# Patient Record
Sex: Female | Born: 1975 | Race: Black or African American | Hispanic: No | Marital: Single | Smoking: Former smoker
Health system: Southern US, Community
[De-identification: ages and names within clinical notes are randomized; demographics above are authoritative.]

---

## 1997-08-02 ENCOUNTER — Ambulatory Visit (HOSPITAL_COMMUNITY): Admission: RE | Admit: 1997-08-02 | Discharge: 1997-08-02 | Payer: Self-pay | Admitting: Obstetrics & Gynecology

## 1997-09-15 ENCOUNTER — Inpatient Hospital Stay (HOSPITAL_COMMUNITY): Admission: AD | Admit: 1997-09-15 | Discharge: 1997-09-15 | Payer: Self-pay | Admitting: Obstetrics & Gynecology

## 1997-09-19 ENCOUNTER — Inpatient Hospital Stay (HOSPITAL_COMMUNITY): Admission: AD | Admit: 1997-09-19 | Discharge: 1997-09-19 | Payer: Self-pay | Admitting: Obstetrics

## 1997-09-19 ENCOUNTER — Encounter: Admission: RE | Admit: 1997-09-19 | Discharge: 1997-12-18 | Payer: Self-pay | Admitting: Obstetrics & Gynecology

## 1997-09-26 ENCOUNTER — Inpatient Hospital Stay (HOSPITAL_COMMUNITY): Admission: AD | Admit: 1997-09-26 | Discharge: 1997-09-26 | Payer: Self-pay | Admitting: Obstetrics

## 1997-09-27 ENCOUNTER — Inpatient Hospital Stay (HOSPITAL_COMMUNITY): Admission: AD | Admit: 1997-09-27 | Discharge: 1997-09-29 | Payer: Self-pay | Admitting: Obstetrics

## 1997-10-01 ENCOUNTER — Inpatient Hospital Stay (HOSPITAL_COMMUNITY): Admission: EM | Admit: 1997-10-01 | Discharge: 1997-10-01 | Payer: Self-pay | Admitting: Obstetrics & Gynecology

## 2002-01-13 ENCOUNTER — Other Ambulatory Visit: Admission: RE | Admit: 2002-01-13 | Discharge: 2002-01-13 | Payer: Self-pay | Admitting: Gynecology

## 2002-06-11 ENCOUNTER — Encounter: Payer: Self-pay | Admitting: Gynecology

## 2002-06-11 ENCOUNTER — Inpatient Hospital Stay (HOSPITAL_COMMUNITY): Admission: AD | Admit: 2002-06-11 | Discharge: 2002-06-11 | Payer: Self-pay | Admitting: Gynecology

## 2002-07-27 ENCOUNTER — Inpatient Hospital Stay (HOSPITAL_COMMUNITY): Admission: AD | Admit: 2002-07-27 | Discharge: 2002-07-29 | Payer: Self-pay | Admitting: Gynecology

## 2002-07-27 ENCOUNTER — Encounter (INDEPENDENT_AMBULATORY_CARE_PROVIDER_SITE_OTHER): Payer: Self-pay

## 2003-02-02 ENCOUNTER — Other Ambulatory Visit: Admission: RE | Admit: 2003-02-02 | Discharge: 2003-02-02 | Payer: Self-pay | Admitting: Gynecology

## 2004-06-30 ENCOUNTER — Other Ambulatory Visit: Admission: RE | Admit: 2004-06-30 | Discharge: 2004-06-30 | Payer: Self-pay | Admitting: Gynecology

## 2005-07-03 ENCOUNTER — Other Ambulatory Visit: Admission: RE | Admit: 2005-07-03 | Discharge: 2005-07-03 | Payer: Self-pay | Admitting: Gynecology

## 2006-07-19 ENCOUNTER — Other Ambulatory Visit: Admission: RE | Admit: 2006-07-19 | Discharge: 2006-07-19 | Payer: Self-pay | Admitting: Gynecology

## 2007-07-11 ENCOUNTER — Emergency Department (HOSPITAL_COMMUNITY): Admission: EM | Admit: 2007-07-11 | Discharge: 2007-07-11 | Payer: Self-pay | Admitting: Emergency Medicine

## 2007-07-22 ENCOUNTER — Other Ambulatory Visit: Admission: RE | Admit: 2007-07-22 | Discharge: 2007-07-22 | Payer: Self-pay | Admitting: Obstetrics and Gynecology

## 2008-01-18 ENCOUNTER — Other Ambulatory Visit: Admission: RE | Admit: 2008-01-18 | Discharge: 2008-01-18 | Payer: Self-pay | Admitting: Gynecology

## 2008-04-02 ENCOUNTER — Ambulatory Visit: Payer: Self-pay | Admitting: Gynecology

## 2008-07-27 ENCOUNTER — Encounter: Payer: Self-pay | Admitting: Women's Health

## 2008-07-27 ENCOUNTER — Ambulatory Visit: Payer: Self-pay | Admitting: Women's Health

## 2008-07-27 ENCOUNTER — Other Ambulatory Visit: Admission: RE | Admit: 2008-07-27 | Discharge: 2008-07-27 | Payer: Self-pay | Admitting: Gynecology

## 2008-08-24 ENCOUNTER — Ambulatory Visit: Payer: Self-pay | Admitting: Women's Health

## 2009-09-13 ENCOUNTER — Ambulatory Visit: Payer: Self-pay | Admitting: Family

## 2009-09-13 ENCOUNTER — Inpatient Hospital Stay (HOSPITAL_COMMUNITY): Admission: AD | Admit: 2009-09-13 | Discharge: 2009-09-13 | Payer: Self-pay | Admitting: Obstetrics and Gynecology

## 2009-11-07 ENCOUNTER — Observation Stay (HOSPITAL_COMMUNITY): Admission: AD | Admit: 2009-11-07 | Discharge: 2009-11-08 | Payer: Self-pay | Admitting: Obstetrics and Gynecology

## 2009-12-07 ENCOUNTER — Inpatient Hospital Stay (HOSPITAL_COMMUNITY): Admission: AD | Admit: 2009-12-07 | Discharge: 2009-12-07 | Payer: Self-pay | Admitting: Obstetrics and Gynecology

## 2009-12-19 ENCOUNTER — Ambulatory Visit (HOSPITAL_COMMUNITY): Admission: RE | Admit: 2009-12-19 | Discharge: 2009-12-19 | Payer: Self-pay | Admitting: Obstetrics and Gynecology

## 2010-01-01 ENCOUNTER — Ambulatory Visit: Payer: Self-pay | Admitting: Advanced Practice Midwife

## 2010-01-01 ENCOUNTER — Inpatient Hospital Stay (HOSPITAL_COMMUNITY): Admission: AD | Admit: 2010-01-01 | Discharge: 2010-01-01 | Payer: Self-pay | Admitting: Obstetrics and Gynecology

## 2010-01-09 ENCOUNTER — Inpatient Hospital Stay (HOSPITAL_COMMUNITY): Admission: AD | Admit: 2010-01-09 | Discharge: 2010-01-10 | Payer: Self-pay | Admitting: Obstetrics and Gynecology

## 2010-01-21 ENCOUNTER — Inpatient Hospital Stay (HOSPITAL_COMMUNITY): Admission: RE | Admit: 2010-01-21 | Discharge: 2010-01-23 | Payer: Self-pay | Admitting: Obstetrics & Gynecology

## 2010-03-19 ENCOUNTER — Other Ambulatory Visit: Admission: RE | Admit: 2010-03-19 | Discharge: 2010-03-19 | Payer: Self-pay | Admitting: Gynecology

## 2010-03-19 ENCOUNTER — Ambulatory Visit: Payer: Self-pay | Admitting: Women's Health

## 2010-03-27 ENCOUNTER — Ambulatory Visit: Payer: Self-pay | Admitting: Gynecology

## 2010-06-03 ENCOUNTER — Ambulatory Visit: Payer: Self-pay | Admitting: Gynecology

## 2010-08-29 LAB — CBC
Hemoglobin: 10.8 g/dL — ABNORMAL LOW (ref 12.0–15.0)
MCHC: 33.2 g/dL (ref 30.0–36.0)
Platelets: 222 10*3/uL (ref 150–400)
RDW: 16.1 % — ABNORMAL HIGH (ref 11.5–15.5)
RDW: 16.2 % — ABNORMAL HIGH (ref 11.5–15.5)
WBC: 12.1 10*3/uL — ABNORMAL HIGH (ref 4.0–10.5)

## 2010-08-29 LAB — RPR: RPR Ser Ql: NONREACTIVE

## 2010-08-30 LAB — URINE MICROSCOPIC-ADD ON

## 2010-08-30 LAB — URINALYSIS, ROUTINE W REFLEX MICROSCOPIC
Nitrite: NEGATIVE
Specific Gravity, Urine: <1.005 — ABNORMAL LOW (ref 1.005–1.030)
Urobilinogen, UA: 0.2 mg/dL (ref 0.0–1.0)

## 2010-08-31 LAB — URINE CULTURE: Colony Count: 50000

## 2010-08-31 LAB — WET PREP, GENITAL

## 2010-08-31 LAB — URINE MICROSCOPIC-ADD ON

## 2010-08-31 LAB — URINALYSIS, ROUTINE W REFLEX MICROSCOPIC
Glucose, UA: NEGATIVE mg/dL
Hgb urine dipstick: NEGATIVE
Ketones, ur: NEGATIVE mg/dL
Protein, ur: NEGATIVE mg/dL

## 2010-09-01 LAB — CBC
HCT: 31.4 % — ABNORMAL LOW (ref 36.0–46.0)
Hemoglobin: 10.6 g/dL — ABNORMAL LOW (ref 12.0–15.0)
MCHC: 33.7 g/dL (ref 30.0–36.0)
MCV: 91.3 fL (ref 78.0–100.0)
RDW: 14.2 % (ref 11.5–15.5)

## 2010-09-01 LAB — URINALYSIS, ROUTINE W REFLEX MICROSCOPIC
Bilirubin Urine: NEGATIVE
Nitrite: NEGATIVE
Protein, ur: NEGATIVE mg/dL
Specific Gravity, Urine: 1.025 (ref 1.005–1.030)
Urobilinogen, UA: 0.2 mg/dL (ref 0.0–1.0)

## 2010-09-01 LAB — URINE CULTURE: Colony Count: 40000

## 2010-09-01 LAB — FETAL FIBRONECTIN: Fetal Fibronectin: NEGATIVE

## 2010-09-01 LAB — URINE MICROSCOPIC-ADD ON

## 2010-10-31 NOTE — Discharge Summary (Signed)
   NAME:  [name], [name]                         ACCOUNT NO.:  [id number]   MEDICAL RECORD NO.:  [id number]                   PATIENT TYPE:  INP   LOCATION:  9105                                 FACILITY:  [facility]   PHYSICIAN:  Juan H. Lily Peer, M.D.             DATE OF BIRTH:  July 10, 1975   DATE OF ADMISSION:  07/27/2002  DATE OF DISCHARGE:  07/29/2002                                 DISCHARGE SUMMARY   DISCHARGE DIAGNOSES:  1. Intrauterine pregnancy at 38 weeks, delivered.  2. Status post spontaneous vaginal delivery.  3. History of poor obstetrical history.  4. History of intrauterine fetal demise at 40 weeks thought to be secondary     to umbilical clot.   HISTORY:  This is a 26-years-of-age female gravida 3 para 2 with an EDC of  August 10, 2002.  The prenatal course had been complicated by history of  poor OB history.  She had had an IUFD at term thought to be secondary to  umbilical clot.  She also had a second child with undetermined abnormality  syndrome somewhere in-between mental retardation and autism.  Had normal  amniocentesis in this pregnancy.   HOSPITAL COURSE:  On July 27, 2002 the patient presented at 38 weeks in  labor.  Subsequently, on July 27, 2002 the patient underwent delivery of  a female infant, Apgars 9 and 9, weight of 7 pounds 12 ounces.  There was no  episiotomy, no lacerations.  Postpartum the patient remained afebrile,  voiding, in stable condition.  She was discharged to home on July 29, 2002 and given [facility] Gynecology postpartum instructions and postpartum  booklet.   ACCESSORY CLINICAL FINDINGS/LABORATORY DATA:  The patient is A positive.  Rubella immune.  On July 28, 2002 hemoglobin was 7.5.   DISPOSITION:  1. The patient was discharged to home.  2. Given prescription for Tylox p.r.n. pain.     Susa Loffler, P.A.                    Juan H. Lily Peer, M.D.    TSG/MEDQ  D:  08/18/2002  T:  08/19/2002  Job:   [id number]

## 2010-10-31 NOTE — Consult Note (Signed)
NAME:  [name], [name]                         ACCOUNT NO.:  [id number]   MEDICAL RECORD NO.:  [id number]                   PATIENT TYPE:  MAT   LOCATION:  MATC                                 [facility]ACILITY:  [facility]   PHYSICIAN:  Juan H. Lily Peer, M.D.             DATE OF BIRTH:  1975/08/29   DATE OF CONSULTATION:  DATE OF DISCHARGE:                                   CONSULTATION   HISTORY OF PRESENT ILLNESS:  The patient is a 35 year old Gravida III, Para  II with an estimated date of confinement August 10, 2002, currently 59 and  1/[redacted] weeks gestational age. She presented to [facility] today stating  that since last night, she had felt some upper abdominal discomfort and as a  result, was thinking of her child. She denied any vaginal discharge,  dysuria, or frequency of any sort. She was placed on a monitor and a  reassuring tracing was noted. She is noted to have some mild contractions at  times and while waiting for an ultrasound, she received IV fluids of  lactated ringers fluid bolus 500 cc and followed by 150 cc per hour. A wet  prep was done which actually demonstrated bacteria and many WBC's. And  subsequently, Terbutaline 0.25 mg subcutaneous was given to arrest the mild  contraction that she was having while we were waiting for the ultrasound.  She continued to do well. Her abdominal pain improved. She had no abdominal  tenderness, rebound or guarding.   PHYSICAL EXAMINATION:  VITAL SIGNS: Blood pressure 113/68, pulse 88,  temperature 98.2.  PELVIC: Cervix revealed external os to be slightly dilated. Internal  cervical os closed and 40-50% effaced ballottable.   DIAGNOSTIC IMPRESSION:  The patient underwent an ultrasound which  demonstrated an intrauterine viable pregnancy of cephalic presentation with  a posterior placenta. No previa or abruption, grade I. The amniotic fluid  was appropriate for gestational age and based on the early ultrasound,  concurs with today's  estimated gestational age of [redacted] weeks with estimated  fetal weight of 1928 to 2070 grams. Transcervical ultrasound demonstrated  cervical length of 3.3 cm.   LABORATORY DATA:  The patient's blood work had consisted of a CBC with WBC  count of 9.1. Hemoglobin and hematocrit 11.4 and 33.9. Platelet count  270,000. UA had some mild proteinuria for IV fluids and as mentioned above,  wet prep had many WBC's and many bacteria.   HOSPITAL COURSE:  The patient will be released home. She is no longer  contracting. She was feeling fine. She will be given a note to stay at home  from work for the next 2-3 days. She has an appointment for next week in the  office. I will give her a prescription for Cleocin vaginal cream, to apply  at bedtime for five days for bacterial overgrowth, which she has. We also  discussed, at the request of records, that two  previous pregnancies were  delivered at [facility] by the [facility]. She did state that  the first pregnancy was an IUFD and they found a blood clot on the umbilical  cord. Based on these findings, we will go ahead and start her on baby  aspirin one tab each day. Also, we will await a copy of her records but  based on this, I think we are justifying that the benefit will out weight  the risk here and this was discussed with the patient. The patient is to  refrain from intercourse. Keep up with her fluids. If she complains of  abdominal discomfort again or any pressure, she will report to the office or  the emergency room prior to her scheduled appointment for next week.                                               Juan H. Lily Peer, M.D.    JHF/MEDQ  D:  06/11/2002  T:  06/11/2002  Job:  [id number]

## 2011-03-22 ENCOUNTER — Emergency Department (HOSPITAL_COMMUNITY)
Admission: EM | Admit: 2011-03-22 | Discharge: 2011-03-22 | Disposition: A | Discharge status: Home / Self Care | Payer: 59 | Attending: Emergency Medicine | Admitting: Emergency Medicine

## 2011-03-22 DIAGNOSIS — R51 Headache: Secondary | ICD-10-CM | POA: Insufficient documentation

## 2011-03-22 DIAGNOSIS — H9209 Otalgia, unspecified ear: Secondary | ICD-10-CM | POA: Insufficient documentation

## 2011-03-22 DIAGNOSIS — J36 Peritonsillar abscess: Secondary | ICD-10-CM | POA: Insufficient documentation

## 2011-04-06 NOTE — Consult Note (Signed)
[name], [name]               ACCOUNT NO.:  [id number]  MEDICAL RECORD NO.:  [id number]  LOCATION:  WLED                         FACILITY:  [facility]  PHYSICIAN:  Newman Pies, MD            DATE OF BIRTH:  07-11-1975  DATE OF CONSULTATION:  03/22/2011 DATE OF DISCHARGE:  03/22/2011                                CONSULTATION   SERVICE:  Otolaryngology and neck surgery service.  CHIEF COMPLAINT:  Right peritonsillar abscess.  HISTORY OF PRESENT ILLNESS:  The patient is a 35 year old female who presents to the [facility] emergency room today complaining of 5-day history of severe right-sided sore throat.  The patient was initially seen in the Urgent [facility].  She was given oral amoxicillin and IV steroids.  However, her right-sided sore throat significantly worsened over the last 24 hours.  The patient complains of significant odynophagia and dysphagia.  She also complains of difficulty talking. She has subjective low grade fever.  No shortness of breath.  She denies any previous history of peritonsillar abscess or tonsillitis.  PAST MEDICAL HISTORY: 1. Depression. 2. Urinary tract infection.  PAST SURGICAL HISTORY:  Dilation and curettage for termination of pregnancy  FAMILY HISTORY:  Positive for hypertension and cancer.  ALLERGIES:  No known drug allergies.  HOME MEDICATIONS:  Amoxicillin, Zoloft, Xanax, hydrocodone and acetaminophen, and elixir.  SOCIAL HISTORY:  The patient is a nonsmoker.  She denies the use of illegal drugs.  Occasional drinker.  IMMUNIZATIONS:  Up-to-date.  PHYSICAL EXAMINATIONS:  VITALS:  Temperature 99.5, blood pressure 128/84, pulse 92, respiration 16, and oxygen saturation 100% on room air. GENERAL:  The patient is a well-nourished and well-developed 35 year old female in no acute distress.  She is alert and oriented x3. HEENT:  Her pupils are equal, round, and reactive to light.  Extraocular motion is intact.  Examination of the ears  shows normal auricles, external auditory canals, and tympanic membranes bilaterally.  Nasal examination shows normal mucosa, septum, and turbinates.  Oral cavity examination shows normal lips, gums, tongue, and oral cavity mucosa. The right tonsil is severely edematous and erythematous.  The right peritonsillar area is also severely edematous, pushing the uvula to the left side. NECK:  Palpation of the neck reveals no significant lymphadenopathy or mass.  The trachea is midline.  The right side of the neck is mildly tender to touch.  PROCEDURE PERFORMED:  Incision and drainage of right peritonsillar abscess.  ANESTHESIA:  Local anesthesia with 1% lidocaine with 1:100,000 epinephrine.  DESCRIPTION:  The patient is placed upright on the hospital bed.  1% lidocaine with 1:100,000 epinephrine is infiltrated around the right peritonsillar area.  After adequate local anesthesia is achieved, an 18- gauge spinal needle is used to make multiple passes over the right superior peritonsillar area.  Approximately 4 mL of purulent fluid is aspirated.  Using the tip of the spinal needle, the right peritonsillar area is carefully incised.  The patient tolerated the procedure well.  IMPRESSION:  Right peritonsillar abscess.  RECOMMENDATIONS: 1. Incision and drainage of right peritonsillar abscess in the     emergency room. 2. Clindamycin 300 mg p.o. q.i.d. for 10  days. 3. The patient will follow up in my office in approximately 1 week if     she continues to be symptomatic.     Newman Pies, MD     ST/MEDQ  D:  03/22/2011  T:  03/22/2011  Job:  [id number]  Electronically Signed by Newman Pies MD on 04/06/2011 05:44:38 PM

## 2012-04-28 ENCOUNTER — Emergency Department (HOSPITAL_COMMUNITY): Payer: Self-pay

## 2012-04-28 ENCOUNTER — Emergency Department (HOSPITAL_COMMUNITY)
Admission: EM | Admit: 2012-04-28 | Discharge: 2012-04-28 | Disposition: A | Discharge status: Home / Self Care | Payer: Self-pay | Attending: Emergency Medicine | Admitting: Emergency Medicine

## 2012-04-28 ENCOUNTER — Encounter (HOSPITAL_COMMUNITY): Payer: Self-pay | Admitting: *Deleted

## 2012-04-28 DIAGNOSIS — M5410 Radiculopathy, site unspecified: Secondary | ICD-10-CM

## 2012-04-28 DIAGNOSIS — E119 Type 2 diabetes mellitus without complications: Secondary | ICD-10-CM | POA: Insufficient documentation

## 2012-04-28 DIAGNOSIS — J069 Acute upper respiratory infection, unspecified: Secondary | ICD-10-CM | POA: Insufficient documentation

## 2012-04-28 DIAGNOSIS — M5412 Radiculopathy, cervical region: Secondary | ICD-10-CM | POA: Insufficient documentation

## 2012-04-28 DIAGNOSIS — R05 Cough: Secondary | ICD-10-CM | POA: Insufficient documentation

## 2012-04-28 DIAGNOSIS — R059 Cough, unspecified: Secondary | ICD-10-CM | POA: Insufficient documentation

## 2012-04-28 DIAGNOSIS — J45901 Unspecified asthma with (acute) exacerbation: Secondary | ICD-10-CM | POA: Insufficient documentation

## 2012-04-28 DIAGNOSIS — I10 Essential (primary) hypertension: Secondary | ICD-10-CM | POA: Insufficient documentation

## 2012-04-28 MED ORDER — HYDROCODONE-HOMATROPINE 5-1.5 MG/5ML PO SYRP: 5.0000 mL | ORAL_SOLUTION | Freq: Four times a day (QID) | ORAL | Status: DC | PRN

## 2012-04-28 MED ORDER — CYCLOBENZAPRINE HCL 10 MG PO TABS: 10.0000 mg | ORAL_TABLET | Freq: Three times a day (TID) | ORAL | Status: DC | PRN

## 2012-04-28 MED ORDER — ALBUTEROL SULFATE (5 MG/ML) 0.5% IN NEBU
5.0000 mg | INHALATION_SOLUTION | Freq: Once | RESPIRATORY_TRACT | Status: AC
  Administered 2012-04-28: 5 mg via RESPIRATORY_TRACT
  Filled 2012-04-28: qty 1

## 2012-04-28 NOTE — ED Notes (Signed)
Ambulated with pulse ox Pt sats at 100%.  PA Paz aware.

## 2012-04-28 NOTE — ED Notes (Signed)
Pt states that she has not been able to lay down and sleep, she has had to sit up due to shortness of breath

## 2012-04-28 NOTE — ED Provider Notes (Signed)
History     CSN: [id number]  Arrival date & time 04/28/12  1720   First MD Initiated Contact with Patient 04/28/12 1737      Chief Complaint  Patient presents with  . Shortness of Breath    (Consider location/radiation/quality/duration/timing/severity/associated sxs/prior treatment) HPI Comments: 36 year old with a history of asthma, diabetes and hypertension single presents emergency department with multiple complaints.  Primary complaint is shortness of breath onset 3 days ago.  Symptoms associated with cough that started 2 days ago.  Cough is dry and nonproductive.  Patient reports that it is difficult lying flat and ureter breathing when sitting up.  Patient denies any chest pain, pleurisy, leg swelling, calf pain, recent travel, estrogen use, smoking history, palpitations or lightheadedness.  In addition patient states that she thinks she slept wrong in her left shoulder is hurting.  Movements with neck and arm causes radicular type pain that shoots down to her elbow.  Patient denies any numbness weakness or tingling of extremity.  There's been no recent trauma.  The history is provided by the patient.    History reviewed. No pertinent past medical history.  History reviewed. No pertinent past surgical history.  No family history on file.  History  Substance Use Topics  . Smoking status: Never Smoker   . Smokeless tobacco: Not on file  . Alcohol Use: Yes    OB History    Grav Para Term Preterm Abortions TAB SAB Ect Mult Living                  Review of Systems  Constitutional: Negative for fever, chills and appetite change.  HENT: Negative for congestion.   Eyes: Negative for visual disturbance.  Respiratory: Positive for cough and shortness of breath. Negative for chest tightness, wheezing and stridor.   Cardiovascular: Negative for chest pain, palpitations and leg swelling.  Gastrointestinal: Negative for abdominal pain.  Genitourinary: Negative for dysuria,  urgency and frequency.  Musculoskeletal:       Left shoulder/neck pain   Neurological: Negative for dizziness, syncope, weakness, light-headedness, numbness and headaches.  Psychiatric/Behavioral: Negative for confusion.  All other systems reviewed and are negative.    Allergies  Review of patient's allergies indicates no known allergies.  Home Medications   Current Outpatient Rx  Name  Route  Sig  Dispense  Refill  . ACETAMINOPHEN 500 MG PO TABS   Oral   Take 500 mg by mouth every 6 (six) hours as needed. For pain         . ALPRAZOLAM 0.5 MG PO TABS   Oral   Take 0.5 mg by mouth daily as needed. For anxiety         . ASPIRIN 325 MG PO TABS   Oral   Take 325 mg by mouth daily as needed. For pain           BP 143/82  Pulse 90  Temp 98.3 F (36.8 C) (Oral)  Resp 16  SpO2 100%  Physical Exam  Nursing note and vitals reviewed. Constitutional: She is oriented to person, place, and time. She appears well-developed and well-nourished. No distress.  HENT:  Head: Normocephalic and atraumatic.       Nasal congestion & rhinorrhea. Normal L & R external ear canals and TMs. No tragal or mastoid tenderness. Oropharynx moist, without tonsillar exudate.   Eyes:       No pain w EOM. Conjunctiva normal   Neck: Normal range of motion.  Soft, no nuchal rigidity. No lymphadenopathy. Muscular soreness over left trapezius   Cardiovascular:       RRR, no aberrancy on auscultation  Pulmonary/Chest: Effort normal.       LCAB, no respiratory distress  Musculoskeletal: Normal range of motion.       Mild radicular type pain felt w shoulder ROM, no crepitus or bony ttp  Neurological: She is alert and oriented to person, place, and time.  Skin: Skin is warm and dry. She is not diaphoretic.       No rash  Psychiatric: She has a normal mood and affect. Her behavior is normal.    ED Course  Procedures (including critical care time)  Labs Reviewed - No data to display Dg Chest  2 View  04/28/2012  *RADIOLOGY REPORT*  Clinical Data: Left chest/shoulder pain, shortness of breath  CHEST - 2 VIEW  Comparison: 07/11/2007  Findings: Lungs are clear. No pleural effusion or pneumothorax.  Cardiomediastinal silhouette is within normal limits.  Visualized osseous structures are within normal limits.  IMPRESSION: Normal chest radiographs.   Original Report Authenticated By: Charline Bills, M.D.      Date: 04/28/2012  Rate: 91  Rhythm: normal sinus rhythm  QRS Axis: normal  Intervals: normal  ST/T Wave abnormalities: normal  Conduction Disutrbances: none  Narrative Interpretation:   Old EKG Reviewed: No old EKG    No diagnosis found. Normal pulse ox while ambulating   MDM  Radicular shoulder pain, URI  Pt CXR negative for acute infiltrate. Patients symptoms are consistent with URI, likely viral etiology. Discussed that antibiotics are not indicated for viral infections. Pt will be discharged with symptomatic treatment.  Verbalizes understanding and is agreeable with plan. Pt is hemodynamically stable & in NAD prior to dc. In addition she c/o left shoulder/neck muscular soreness associated with radicular type pain that shoots down to her elbow w certain movements. Onset occurred after deep sleep in "awkward" position. Muscle relaxers and conservative home therapies discussed.     Radiculopathy   Pt presents to the ER with neck pain that is radiating down arm. There has been no recent trauma and imaging is not indicated at this time. No evidence of cervical nerve root compression on physical exam. DC w conservative home therapies (ie heat).        Jaci Carrel, PA-C 04/28/12 1850  Jaci Carrel, [place] 04/28/12 1858

## 2012-04-28 NOTE — ED Notes (Signed)
Patient with shortness of breath and left arm pain for about three days.  States that it is worse when she lays down, seems to not getting enough air.  Patient also has been coughing for about two days.

## 2012-04-30 NOTE — ED Provider Notes (Signed)
Medical screening examination/treatment/procedure(s) were performed by non-physician practitioner and as supervising physician I was immediately available for consultation/collaboration.  John-Adam Seaver Machia, M.D.     John-Adam Cornelious Diven, MD 04/30/12 1356 

## 2012-07-30 ENCOUNTER — Other Ambulatory Visit: Payer: Self-pay

## 2012-09-20 ENCOUNTER — Other Ambulatory Visit: Payer: Self-pay | Admitting: Family Medicine

## 2012-09-20 ENCOUNTER — Other Ambulatory Visit (HOSPITAL_COMMUNITY)
Admission: RE | Admit: 2012-09-20 | Discharge: 2012-09-20 | Disposition: A | Discharge status: Home / Self Care | Payer: 59 | Source: Ambulatory Visit | Attending: Family Medicine | Admitting: Family Medicine

## 2012-09-20 DIAGNOSIS — Z01419 Encounter for gynecological examination (general) (routine) without abnormal findings: Secondary | ICD-10-CM | POA: Insufficient documentation

## 2013-04-20 ENCOUNTER — Other Ambulatory Visit: Payer: Self-pay

## 2013-07-13 ENCOUNTER — Emergency Department (HOSPITAL_COMMUNITY): Payer: BC Managed Care – PPO

## 2013-07-13 ENCOUNTER — Emergency Department (HOSPITAL_COMMUNITY)
Admission: EM | Admit: 2013-07-13 | Discharge: 2013-07-13 | Disposition: A | Discharge status: Home / Self Care | Payer: BC Managed Care – PPO | Attending: Emergency Medicine | Admitting: Emergency Medicine

## 2013-07-13 ENCOUNTER — Encounter (HOSPITAL_COMMUNITY): Payer: Self-pay | Admitting: Emergency Medicine

## 2013-07-13 DIAGNOSIS — R12 Heartburn: Secondary | ICD-10-CM | POA: Insufficient documentation

## 2013-07-13 DIAGNOSIS — Z7982 Long term (current) use of aspirin: Secondary | ICD-10-CM | POA: Insufficient documentation

## 2013-07-13 DIAGNOSIS — K219 Gastro-esophageal reflux disease without esophagitis: Secondary | ICD-10-CM

## 2013-07-13 DIAGNOSIS — F411 Generalized anxiety disorder: Secondary | ICD-10-CM | POA: Insufficient documentation

## 2013-07-13 DIAGNOSIS — R0789 Other chest pain: Secondary | ICD-10-CM | POA: Insufficient documentation

## 2013-07-13 DIAGNOSIS — Z79899 Other long term (current) drug therapy: Secondary | ICD-10-CM | POA: Insufficient documentation

## 2013-07-13 LAB — COMPREHENSIVE METABOLIC PANEL
ALK PHOS: 47 U/L (ref 39–117)
ALT: 17 U/L (ref 0–35)
AST: 46 U/L — ABNORMAL HIGH (ref 0–37)
Albumin: 4.1 g/dL (ref 3.5–5.2)
BUN: 9 mg/dL (ref 6–23)
CALCIUM: 9.3 mg/dL (ref 8.4–10.5)
CO2: 25 meq/L (ref 19–32)
Chloride: 103 mEq/L (ref 96–112)
Creatinine, Ser: 0.97 mg/dL (ref 0.50–1.10)
GFR, EST AFRICAN AMERICAN: 87 mL/min — AB (ref >90)
GFR, EST NON AFRICAN AMERICAN: 75 mL/min — AB (ref >90)
GLUCOSE: 83 mg/dL (ref 70–99)
POTASSIUM: 4.4 meq/L (ref 3.7–5.3)
SODIUM: 141 meq/L (ref 137–147)
TOTAL PROTEIN: 8 g/dL (ref 6.0–8.3)
Total Bilirubin: 0.5 mg/dL (ref 0.3–1.2)

## 2013-07-13 LAB — CBC
HEMATOCRIT: 39.3 % (ref 36.0–46.0)
HEMOGLOBIN: 13.2 g/dL (ref 12.0–15.0)
MCH: 30.7 pg (ref 26.0–34.0)
MCHC: 33.6 g/dL (ref 30.0–36.0)
MCV: 91.4 fL (ref 78.0–100.0)
Platelets: 306 10*3/uL (ref 150–400)
RBC: 4.3 MIL/uL (ref 3.87–5.11)
RDW: 12.7 % (ref 11.5–15.5)
WBC: 7.6 10*3/uL (ref 4.0–10.5)

## 2013-07-13 LAB — POCT I-STAT TROPONIN I: TROPONIN I, POC: 0 ng/mL (ref 0.00–0.08)

## 2013-07-13 MED ORDER — RANITIDINE HCL 150 MG PO CAPS: 150.0000 mg | ORAL_CAPSULE | Freq: Every day | ORAL | Status: DC

## 2013-07-13 MED ORDER — OMEPRAZOLE 40 MG PO CPDR: 40.0000 mg | DELAYED_RELEASE_CAPSULE | Freq: Every day | ORAL | Status: DC

## 2013-07-13 MED ORDER — GI COCKTAIL ~~LOC~~
30.0000 mL | Freq: Once | ORAL | Status: AC
  Administered 2013-07-13: 30 mL via ORAL
  Filled 2013-07-13: qty 30

## 2013-07-13 NOTE — ED Notes (Signed)
Pt reports left sided chest pain since yesterday. Reports sharp in nature, pain returned today. Denies associated symptoms.

## 2013-07-13 NOTE — Discharge Instructions (Signed)
Follow up with your primary care doctor in 2 days. May need additional evaluation if symptoms persist. Return to ED should you develop worsening chest pain or shortness of breath.

## 2013-07-13 NOTE — ED Provider Notes (Signed)
CSN: [id number]     Arrival date & time 07/13/13  1317 History   First MD Initiated Contact with Patient 07/13/13 1554     Chief Complaint  Patient presents with  . Chest Pain   (Consider location/radiation/quality/duration/timing/severity/associated sxs/prior Treatment) Patient is a 38 y.o. female presenting with chest pain.  Chest Pain  38 yo female presents with c/o intermittent sharp left sided chest pain underneath her left breast. Pain rated currently at 3/10 without radiation. Patient states pain started yesterday around 1 pm. Pain does not seem to be associated with activity. Patient denies fever/chills, SOB, DOE, N/V, and palpitations. Patient does admit to recent indigestion with reflux and heartburn. Patient reports hx of heart burn and reflux that she was seen in ED for a while back, was started on OTC medication for sxs that helped. d Patient no longer taking any medications for her reflux sxs. Denies any prior medical hx. Takes Xanax prn for anxiety.   Advanced age > 42 yo: No HTN: No Hyperlipidemia: No Cigarette smoking: No Diabetes Mellitus: No Family hx of CAD or MI < 54 yo : No Female or Post menopausal: No Cocaine use: No Prior MI: No CABG: No Stress test: Yes (when she was pregnant but cant remember what it showed, though she states she does not recall being informed of abnormal results)    History reviewed. No pertinent past medical history. History reviewed. No pertinent past surgical history. No family history on file. History  Substance Use Topics  . Smoking status: Never Smoker   . Smokeless tobacco: Not on file  . Alcohol Use: Yes   OB History   Grav Para Term Preterm Abortions TAB SAB Ect Mult Living                 Review of Systems  Cardiovascular: Positive for chest pain.  All other systems reviewed and are negative.    Allergies  Review of patient's allergies indicates no known allergies.  Home Medications   Current Outpatient Rx  Name   Route  Sig  Dispense  Refill  . ALPRAZolam (XANAX) 0.5 MG tablet   Oral   Take 0.5 mg by mouth daily as needed. For anxiety         . aspirin 325 MG tablet   Oral   Take 325 mg by mouth daily as needed. For pain         . omeprazole (PRILOSEC) 40 MG capsule   Oral   Take 1 capsule (40 mg total) by mouth daily.   30 capsule   0   . ranitidine (ZANTAC) 150 MG capsule   Oral   Take 1 capsule (150 mg total) by mouth daily.   30 capsule   0    BP 119/72  Pulse 64  Temp(Src) 97.9 F (36.6 C) (Oral)  Resp 17  Ht 5\' 5"  (1.651 m)  Wt 212 lb (96.163 kg)  BMI 35.28 kg/m2  SpO2 100% Physical Exam  Nursing note and vitals reviewed. Constitutional: She is oriented to person, place, and time. She appears well-developed and well-nourished. No distress.  HENT:  Head: Normocephalic and atraumatic.  Eyes: Conjunctivae and EOM are normal. Pupils are equal, round, and reactive to light.  Neck: Normal range of motion. Neck supple. No JVD present.  Cardiovascular: Normal rate and regular rhythm.  Exam reveals no gallop and no friction rub.   No murmur heard. Pulmonary/Chest: Effort normal and breath sounds normal. No respiratory distress. She has no  wheezes. She has no rales.  Musculoskeletal: Normal range of motion. She exhibits no edema.  Neurological: She is alert and oriented to person, place, and time.  Skin: Skin is warm and dry. She is not diaphoretic.  Psychiatric: She has a normal mood and affect. Her behavior is normal.    ED Course  Procedures (including critical care time) Labs Review Labs Reviewed  COMPREHENSIVE METABOLIC PANEL - Abnormal; Notable for the following:    AST 46 (*)    GFR calc non Af Amer 75 (*)    GFR calc Af Amer 87 (*)    All other components within normal limits  CBC  POCT I-STAT TROPONIN I   Imaging Review Dg Chest 2 View  07/13/2013   CLINICAL DATA:  Left chest pain.  EXAM: CHEST  2 VIEW  COMPARISON:  DG CHEST 2 VIEW dated 04/28/2012   FINDINGS: The lungs are well-expanded. There is no focal infiltrate or pleural effusion nor pneumothorax. The cardiac silhouette is normal in size. The pulmonary vascularity is not engorged. There is no pleural effusion. The mediastinum is normal in width.  IMPRESSION: No active cardiopulmonary disease.   Electronically Signed   By: David  [name]   On: 07/13/2013 14:24    EKG Interpretation    Date/Time:  Thursday July 13 2013 13:20:50 EST Ventricular Rate:  79 PR Interval:  182 QRS Duration: 82 QT Interval:  422 QTC Calculation: 483 R Axis:   58 Text Interpretation:  Normal sinus rhythm Prolonged QT Abnormal ECG No significant change since last tracing Confirmed by SHELDON  MD, CHARLES ([id number]) on 07/13/2013 1:59:00 PM            MDM   1. Chest pain, atypical   2. Esophageal reflux   3. Heart burn    Patient afebrile with normal VS.  EKG shows no significant change from prior.  CXR normal  Troponin negative Mildly elevated AST All additional labs WNL.   Doubt cardiac etiology given patient's low risk and negative workup. Patient CP improved with GI cocktail. Suspect sxs related to GERD, given patient hx of GERD sxs.   Plan to have patient follow up with PCP in 2 days for reevaluation for sx improvement. Discussed results with patient. Patient given return precautions. Patient agrees with plan. Discharged in good condition.   Meds given in ED:  Medications  gi cocktail (Maalox,Lidocaine,Donnatal) (30 mLs Oral Given 07/13/13 1723)    Discharge Medication List as of 07/13/2013  6:31 PM    START taking these medications   Details  omeprazole (PRILOSEC) 40 MG capsule Take 1 capsule (40 mg total) by mouth daily., Starting 07/13/2013, Until Discontinued, Print    ranitidine (ZANTAC) 150 MG capsule Take 1 capsule (150 mg total) by mouth daily., Starting 07/13/2013, Until Discontinued, Print             Rudene AndaJacob Gray Jahna Liebert, PA-C 07/14/13 2019

## 2013-07-14 NOTE — ED Provider Notes (Signed)
Medical screening examination/treatment/procedure(s) were performed by non-physician practitioner and as supervising physician I was immediately available for consultation/collaboration.  EKG Interpretation    Date/Time:  Thursday July 13 2013 13:20:50 EST Ventricular Rate:  79 PR Interval:  182 QRS Duration: 82 QT Interval:  422 QTC Calculation: 483 R Axis:   58 Text Interpretation:  Normal sinus rhythm Prolonged QT Abnormal ECG No significant change since last tracing Confirmed by Bernette MayersSHELDON  MD, CHARLES ([id number]) on 07/13/2013 1:59:00 PM              Alyssa LennertJoseph L Shanley Furlough, MD 07/14/13 2251

## 2013-10-12 ENCOUNTER — Encounter: Payer: Self-pay | Admitting: Women's Health

## 2013-10-12 ENCOUNTER — Ambulatory Visit (INDEPENDENT_AMBULATORY_CARE_PROVIDER_SITE_OTHER): Payer: BC Managed Care – PPO | Admitting: Women's Health

## 2013-10-12 VITALS — BP 116/76 | Ht 65.0 in | Wt 210.0 lb

## 2013-10-12 DIAGNOSIS — B373 Candidiasis of vulva and vagina: Secondary | ICD-10-CM

## 2013-10-12 DIAGNOSIS — Z13220 Encounter for screening for lipoid disorders: Secondary | ICD-10-CM

## 2013-10-12 DIAGNOSIS — B3731 Acute candidiasis of vulva and vagina: Secondary | ICD-10-CM

## 2013-10-12 DIAGNOSIS — Z01419 Encounter for gynecological examination (general) (routine) without abnormal findings: Secondary | ICD-10-CM

## 2013-10-12 DIAGNOSIS — N898 Other specified noninflammatory disorders of vagina: Secondary | ICD-10-CM

## 2013-10-12 LAB — CBC WITH DIFFERENTIAL/PLATELET
Basophils Absolute: 0 10*3/uL (ref 0.0–0.1)
Basophils Relative: 0 % (ref 0–1)
EOS ABS: 0.3 10*3/uL (ref 0.0–0.7)
EOS PCT: 4 % (ref 0–5)
HEMATOCRIT: 35.1 % — AB (ref 36.0–46.0)
Hemoglobin: 11.6 g/dL — ABNORMAL LOW (ref 12.0–15.0)
LYMPHS ABS: 2.4 10*3/uL (ref 0.7–4.0)
LYMPHS PCT: 32 % (ref 12–46)
MCH: 29.3 pg (ref 26.0–34.0)
MCHC: 33 g/dL (ref 30.0–36.0)
MCV: 88.6 fL (ref 78.0–100.0)
MONO ABS: 0.4 10*3/uL (ref 0.1–1.0)
MONOS PCT: 6 % (ref 3–12)
Neutro Abs: 4.3 10*3/uL (ref 1.7–7.7)
Neutrophils Relative %: 58 % (ref 43–77)
Platelets: 320 10*3/uL (ref 150–400)
RBC: 3.96 MIL/uL (ref 3.87–5.11)
RDW: 13.2 % (ref 11.5–15.5)
WBC: 7.4 10*3/uL (ref 4.0–10.5)

## 2013-10-12 LAB — WET PREP FOR TRICH, YEAST, CLUE
Clue Cells Wet Prep HPF POC: NONE SEEN
Trich, Wet Prep: NONE SEEN

## 2013-10-12 LAB — LIPID PANEL
CHOL/HDL RATIO: 3.5 ratio
Cholesterol: 157 mg/dL (ref 0–200)
HDL: 45 mg/dL (ref >39)
LDL Cholesterol: 100 mg/dL — ABNORMAL HIGH (ref 0–99)
Triglycerides: 62 mg/dL (ref <150)
VLDL: 12 mg/dL (ref 0–40)

## 2013-10-12 MED ORDER — FLUCONAZOLE 150 MG PO TABS: 150.0000 mg | ORAL_TABLET | Freq: Once | ORAL | Status: DC

## 2013-10-12 NOTE — Addendum Note (Signed)
Addended by: Richardson ChiquitoWILKINSON, Pollyanna Levay S on: 10/12/2013 11:36 AM   Modules accepted: Orders

## 2013-10-12 NOTE — Progress Notes (Addendum)
[name] 03/09/1976 [id number]    History:    Presents for annual exam. Rare bleeding with Mirena IUD placed 03/2010 per Dr. Audie BoxFontaine. Same partner. Normal Pap history. Normal Pap history. HSV 1 history. Has had problems with anxiety/depression in the past on no medication.  Past medical history, past surgical history, family history and social history were all reviewed and documented in the EPIC chart. 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  ROS:  A  12 POINT ROS was performed and pertinent positives and negatives are included.  Exam:  Filed Vitals:   10/12/13 1019  BP: 116/76    General appearance:  Normal Thyroid:  Symmetrical, normal in size, without palpable masses or nodularity. Respiratory  Auscultation:  Clear without wheezing or rhonchi Cardiovascular  Auscultation:  Regular rate, without rubs, murmurs or gallops  Edema/varicosities:  Not grossly evident Abdominal  Soft,nontender, without masses, guarding or rebound.  Liver/spleen:  No organomegaly noted  Hernia:  None appreciated  Skin  Inspection:  Grossly normal   Breasts: Examined lying and sitting.     Right: Without masses, retractions, discharge or axillary adenopathy.     Left: Without masses, retractions, discharge or axillary adenopathy. Gentitourinary   Inguinal/mons:  Normal without inguinal adenopathy  External genitalia:  Normal  BUS/Urethra/Skene's glands:  Normal  Vagina:  Wet prep positive for yeast  Cervix:  Normal/IUD string in os  Uterus:   normal in size, shape and contour.  Midline and mobile  Adnexa/parametria:     Rt: Without masses or tenderness.   Lt: Without masses or tenderness.  Anus and perineum: Normal  Digital rectal exam: Normal sphincter tone without palpated masses or tenderness  Assessment/Plan:  38 y.o. SBF G4P3 for annual exam with  complaint half vaginal discharge with occasional itching.     Mirena IUD placed 03/2010 with rare bleeding Obesity Yeast  Plan: SBE's, continue regular exercise, calcium rich diet, cut calories for weight loss, vitamin D 1000 daily encouraged. Diflucan 150 by mouth x1 dose prescription, proper use given and reviewed yeast prevention. CBC, lipid panel, UA, Pap normal 2014, new screening guidelines reviewed.    [name] [facility]NP, 10:50 AM 10/12/2013

## 2013-10-12 NOTE — Patient Instructions (Signed)

## 2013-10-13 LAB — URINALYSIS W MICROSCOPIC + REFLEX CULTURE
BILIRUBIN URINE: NEGATIVE
CASTS: NONE SEEN
CRYSTALS: NONE SEEN
GLUCOSE, UA: NEGATIVE mg/dL
Hgb urine dipstick: NEGATIVE
KETONES UR: NEGATIVE mg/dL
NITRITE: NEGATIVE
PH: 6 (ref 5.0–8.0)
Protein, ur: NEGATIVE mg/dL
SPECIFIC GRAVITY, URINE: 1.025 (ref 1.005–1.030)
Urobilinogen, UA: 0.2 mg/dL (ref 0.0–1.0)

## 2013-10-13 NOTE — Addendum Note (Signed)
Addended by: Richardson ChiquitoWILKINSON, Hasten Sweitzer S on: 10/13/2013 09:53 AM   Modules accepted: Orders

## 2013-10-14 LAB — URINE CULTURE
COLONY COUNT: NO GROWTH
ORGANISM ID, BACTERIA: NO GROWTH

## 2013-10-15 ENCOUNTER — Encounter: Payer: Self-pay | Admitting: Women's Health

## 2014-01-30 ENCOUNTER — Ambulatory Visit (INDEPENDENT_AMBULATORY_CARE_PROVIDER_SITE_OTHER): Payer: BC Managed Care – PPO | Admitting: Women's Health

## 2014-01-30 ENCOUNTER — Encounter: Payer: Self-pay | Admitting: Women's Health

## 2014-01-30 DIAGNOSIS — N898 Other specified noninflammatory disorders of vagina: Secondary | ICD-10-CM

## 2014-01-30 DIAGNOSIS — B3731 Acute candidiasis of vulva and vagina: Secondary | ICD-10-CM

## 2014-01-30 DIAGNOSIS — B373 Candidiasis of vulva and vagina: Secondary | ICD-10-CM

## 2014-01-30 LAB — WET PREP FOR TRICH, YEAST, CLUE
Clue Cells Wet Prep HPF POC: NONE SEEN
Trich, Wet Prep: NONE SEEN
YEAST WET PREP: NONE SEEN

## 2014-01-30 MED ORDER — FLUCONAZOLE 150 MG PO TABS: 150.0000 mg | ORAL_TABLET | Freq: Once | ORAL | Status: DC

## 2014-01-30 NOTE — Addendum Note (Signed)
Addended by: Harrington ChallengerYOUNG, Cendy Oconnor J on: 01/30/2014 02:19 PM   Modules accepted: Orders

## 2014-01-30 NOTE — Progress Notes (Signed)
Patient ID: [name], female   DOB: 01/19/1976, 38 y.o.   MRN: [id number] Presents with complaint of white discharge with itching for 2 days. Denies abdominal pain, urinary symptoms or fever. Questions partners fidelity. Mirena IUD 03/2010 with rare spotting.  Exam: Appears well. External genitalia within normal limits, no erythema, speculum exam moderate amount of white discharge without odor, wet prep negative, GC/Chlamydia culture taken, IUD strings visible at os. Bimanual no CMT or adnexal fullness or tenderness.  Clinical yeast  Plan: Diflucan 150 mg times one dose with refill. Instructed to call if no relief of symptoms. Denies need to check HIV, hepatitis or RPR.

## 2014-01-31 LAB — GC/CHLAMYDIA PROBE AMP
CT PROBE, AMP APTIMA: NEGATIVE
GC Probe RNA: NEGATIVE

## 2014-04-16 ENCOUNTER — Encounter: Payer: Self-pay | Admitting: Women's Health

## 2014-04-18 ENCOUNTER — Encounter (HOSPITAL_COMMUNITY): Payer: Self-pay | Admitting: Emergency Medicine

## 2014-04-18 ENCOUNTER — Emergency Department (HOSPITAL_COMMUNITY)
Admission: EM | Admit: 2014-04-18 | Discharge: 2014-04-19 | Disposition: A | Discharge status: Home / Self Care | Payer: BC Managed Care – PPO | Attending: Emergency Medicine | Admitting: Emergency Medicine

## 2014-04-18 DIAGNOSIS — Z79899 Other long term (current) drug therapy: Secondary | ICD-10-CM | POA: Insufficient documentation

## 2014-04-18 DIAGNOSIS — K5900 Constipation, unspecified: Secondary | ICD-10-CM

## 2014-04-18 DIAGNOSIS — R1901 Right upper quadrant abdominal swelling, mass and lump: Secondary | ICD-10-CM | POA: Insufficient documentation

## 2014-04-18 DIAGNOSIS — R1900 Intra-abdominal and pelvic swelling, mass and lump, unspecified site: Secondary | ICD-10-CM

## 2014-04-18 DIAGNOSIS — R1011 Right upper quadrant pain: Secondary | ICD-10-CM | POA: Diagnosis present

## 2014-04-18 NOTE — ED Notes (Signed)
Pt presents with palpable mass to R side of abdomen, pt states she noticed it tonight, only painful on palpation.

## 2014-04-19 ENCOUNTER — Emergency Department (HOSPITAL_COMMUNITY): Payer: BC Managed Care – PPO

## 2014-04-19 MED ORDER — POLYETHYLENE GLYCOL 3350 17 G PO PACK: 17.0000 g | PACK | Freq: Every day | ORAL | Status: DC

## 2014-04-19 NOTE — Discharge Instructions (Signed)

## 2014-04-19 NOTE — ED Notes (Signed)
Pt ambulatory upon DC. She reports she is leaving with ALL belongings she arrived with.

## 2014-04-19 NOTE — ED Notes (Signed)
PA at bedside.

## 2014-04-19 NOTE — ED Provider Notes (Signed)
CSN: [id number]     Arrival date & time 04/18/14  2254 History   First MD Initiated Contact with Patient 04/19/14 0324     Chief Complaint  Patient presents with  . Abdominal Pain     (Consider location/radiation/quality/duration/timing/severity/associated sxs/prior Treatment) Patient is a 38 y.o. female presenting with abdominal pain. The history is provided by the patient. No language interpreter was used.  Abdominal Pain Pain location:  RUQ Associated symptoms: constipation   Associated symptoms: no chills and no fever   Associated symptoms comment:  She presents for evaluation of a palpable mass to the RUQ abdomen she noticed tonight while getting in the shower. No pain, N, V, Cough, SOB or similar symptoms previously. She reports she has been constipated recently. No abdominal surgeries in the past.    History reviewed. No pertinent past medical history. Past Surgical History  Procedure Laterality Date  . Mirena insertion  10/11   No family history on file. History  Substance Use Topics  . Smoking status: Never Smoker   . Smokeless tobacco: Never Used  . Alcohol Use: Yes     Comment: occassionally   OB History    Gravida Para Term Preterm AB TAB SAB Ectopic Multiple Living   5 3   2  2   3      Review of Systems  Constitutional: Negative for fever and chills.  HENT: Negative.   Respiratory: Negative.   Cardiovascular: Negative.   Gastrointestinal: Positive for constipation.       See HPI.  Genitourinary: Negative.  Negative for flank pain and difficulty urinating.  Musculoskeletal: Negative.   Skin: Negative.   Neurological: Negative.       Allergies  Review of patient's allergies indicates no known allergies.  Home Medications   Prior to Admission medications   Medication Sig Start Date End Date Taking? Authorizing Provider  ALPRAZolam Prudy Feeler(XANAX) 0.5 MG tablet Take 0.5 mg by mouth daily as needed for anxiety.    Yes Historical Provider, MD  ibuprofen  (ADVIL,MOTRIN) 200 MG tablet Take 800 mg by mouth every 6 (six) hours as needed for moderate pain.   Yes Historical Provider, MD  levonorgestrel (MIRENA) 20 MCG/24HR IUD 1 each by Intrauterine route once.   Yes Historical Provider, MD  fluconazole (DIFLUCAN) 150 MG tablet Take 1 tablet (150 mg total) by mouth once. 01/30/14   Harrington ChallengerNancy J Young, NP  omeprazole (PRILOSEC) 40 MG capsule Take 1 capsule (40 mg total) by mouth daily. 07/13/13   Rudene AndaJacob Gray Lackey, PA-C  ranitidine (ZANTAC) 150 MG capsule Take 1 capsule (150 mg total) by mouth daily. 07/13/13   Allen NorrisJacob Gray Lackey, PA-C   BP 157/94 mmHg  Pulse 79  Temp(Src) 98 F (36.7 C) (Oral)  Resp 22  Ht 5\' 5"  (1.651 m)  Wt 222 lb (100.699 kg)  BMI 36.94 kg/m2  SpO2 100% Physical Exam  Constitutional: She is oriented to person, place, and time. She appears well-developed and well-nourished.  Neck: Normal range of motion.  Pulmonary/Chest: Effort normal.  Abdominal: Soft. She exhibits mass. She exhibits no distension.  Palpable mobile mass RUQ abdomen that is minimally tender.   Neurological: She is alert and oriented to person, place, and time.  Skin: Skin is warm and dry.  Psychiatric: She has a normal mood and affect.    ED Course  Procedures (including critical care time) Labs Review Labs Reviewed - No data to display  Imaging Review No results found.  Dg Abd 2 Views  04/19/2014   CLINICAL DATA:  Palpable mass to the right side of the abdomen, note is tonight. Painful on palpation.  EXAM: ABDOMEN - 2 VIEW  COMPARISON:  Chest 07/13/2013  FINDINGS: Gas and stool throughout the colon with some gas-filled small bowel loops. No small or large bowel distention. No free intra-abdominal air. No abnormal air-fluid levels. No radiopaque stones. Calcified phleboliths in the pelvis. Intrauterine device. Mild degenerative changes in the spine. 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  IMPRESSION: Nonobstructive bowel gas pattern. No  radiopaque mass lesion appreciated.   Electronically Signed   By: Burman NievesWilliam  Stevens M.D.   On: 04/19/2014 04:34     MDM   Final diagnoses:  Abdominal mass    1. Constipation  Soft, minimally tender mass right abdomen - c/w h/o constipation given no systemic symptoms, essentially nontender, mobile mass. VSS. Will treat with laxative therapy and encourage PCP follow up.    Arnoldo HookerShari A Lord Lancour, PA-C 04/19/14 0453  Loren Raceravid Yelverton, MD 04/20/14 [phone]

## 2014-04-19 NOTE — ED Notes (Signed)
Patient transported to X-ray 

## 2014-08-27 ENCOUNTER — Other Ambulatory Visit (INDEPENDENT_AMBULATORY_CARE_PROVIDER_SITE_OTHER): Payer: Self-pay | Admitting: General Surgery

## 2014-08-27 NOTE — Addendum Note (Signed)
Addended by: Ernestene MentionINGRAM, Jalena Vanderlinden M on: 08/27/2014 03:57 PM   Modules accepted: Orders

## 2014-09-05 ENCOUNTER — Other Ambulatory Visit: Payer: Self-pay | Admitting: *Deleted

## 2014-09-05 DIAGNOSIS — R1901 Right upper quadrant abdominal swelling, mass and lump: Secondary | ICD-10-CM

## 2014-09-07 ENCOUNTER — Ambulatory Visit
Admission: RE | Admit: 2014-09-07 | Discharge: 2014-09-07 | Disposition: A | Discharge status: Home / Self Care | Payer: 59 | Source: Ambulatory Visit | Attending: General Surgery | Admitting: General Surgery

## 2014-09-07 MED ORDER — IOPAMIDOL (ISOVUE-300) INJECTION 61%
125.0000 mL | Freq: Once | INTRAVENOUS | Status: AC | PRN
  Administered 2014-09-07: 125 mL via INTRAVENOUS

## 2015-03-15 ENCOUNTER — Telehealth: Payer: Self-pay | Admitting: Gynecology

## 2015-03-15 NOTE — Telephone Encounter (Signed)
03/15/15-Pt was informed that her [facility] covers the Mirena switch out at 100%, no copay,  for contraception. Appt made with TF for 04/11/15 for this.wl

## 2015-04-11 ENCOUNTER — Encounter: Payer: Self-pay | Admitting: Gynecology

## 2015-04-11 ENCOUNTER — Ambulatory Visit (INDEPENDENT_AMBULATORY_CARE_PROVIDER_SITE_OTHER): Payer: 59 | Admitting: Gynecology

## 2015-04-11 VITALS — BP 120/78

## 2015-04-11 DIAGNOSIS — Z30433 Encounter for removal and reinsertion of intrauterine contraceptive device: Secondary | ICD-10-CM

## 2015-04-11 HISTORY — PX: INTRAUTERINE DEVICE (IUD) INSERTION: SHX5877

## 2015-04-11 NOTE — Patient Instructions (Signed)
Intrauterine Device Insertion Most often, an intrauterine device (IUD) is inserted into the uterus to prevent pregnancy. There are 2 types of IUDs available:  Copper IUD--This type of IUD creates an environment that is not favorable to sperm survival. The mechanism of action of the copper IUD is not known for certain. It can stay in place for 10 years.  Hormone IUD--This type of IUD contains the hormone progestin (synthetic progesterone). The progestin thickens the cervical mucus and prevents sperm from entering the uterus, and it also thins the uterine lining. There is no evidence that the hormone IUD prevents implantation. One hormone IUD can stay in place for up to 5 years, and a different hormone IUD can stay in place for up to 3 years. An IUD is the most cost-effective birth control if left in place for the full duration. It may be removed at any time. LET YOUR HEALTH CARE PROVIDER KNOW ABOUT:  Any allergies you have.  All medicines you are taking, including vitamins, herbs, eye drops, creams, and over-the-counter medicines.  Previous problems you or members of your family have had with the use of anesthetics.  Any blood disorders you have.  Previous surgeries you have had.  Possibility of pregnancy.  Medical conditions you have. RISKS AND COMPLICATIONS  Generally, intrauterine device insertion is a safe procedure. However, as with any procedure, complications can occur. Possible complications include:  Accidental puncture (perforation) of the uterus.  Accidental placement of the IUD either in the muscle layer of the uterus (myometrium) or outside the uterus. If this happens, the IUD can be found essentially floating around the bowels and must be taken out surgically.  The IUD may fall out of the uterus (expulsion). This is more common in women who have recently had a child.   Pregnancy in the fallopian tube (ectopic).  Pelvic inflammatory disease (PID), which is infection of  the uterus and fallopian tubes. The risk of PID is slightly increased in the first 20 days after the IUD is placed, but the overall risk is still very low. BEFORE THE PROCEDURE  Schedule the IUD insertion for when you will have your menstrual period or right after, to make sure you are not pregnant. Placement of the IUD is better tolerated shortly after a menstrual cycle.  You may need to take tests or be examined to make sure you are not pregnant.  You may be required to take a pregnancy test.  You may be required to get checked for sexually transmitted infections (STIs) prior to placement. Placing an IUD in someone who has an infection can make the infection worse.  You may be given a pain reliever to take 1 or 2 hours before the procedure.  An exam will be performed to determine the size and position of your uterus.  Ask your health care provider about changing or stopping your regular medicines. PROCEDURE   A tool (speculum) is placed in the vagina. This allows your health care provider to see the lower part of the uterus (cervix).  The cervix is prepped with a medicine that lowers the risk of infection.  You may be given a medicine to numb each side of the cervix (intracervical or paracervical block). This is used to block and control any discomfort with insertion.  A tool (uterine sound) is inserted into the uterus to determine the length of the uterine cavity and the direction the uterus may be tilted.  A slim instrument (IUD inserter) is inserted through the cervical   canal and into your uterus.  The IUD is placed in the uterine cavity and the insertion device is removed.  The nylon string that is attached to the IUD and used for eventual IUD removal is trimmed. It is trimmed so that it lays high in the vagina, just outside the cervix. AFTER THE PROCEDURE  You may have bleeding after the procedure. This is normal. It varies from light spotting for a few days to menstrual-like  bleeding.  You may have mild cramping.   This information is not intended to replace advice given to you by your health care provider. Make sure you discuss any questions you have with your health care provider.   Document Released: 01/28/2011 Document Revised: 03/22/2013 Document Reviewed: 11/20/2012 Elsevier Interactive Patient Education 2016 Elsevier Inc.  

## 2015-04-11 NOTE — Progress Notes (Signed)
Patient presents for Mirena IUD removal and replacement. She has read through the booklet, has no contraindications and signed the consent form.  I reviewed the removal and insertional process with her as well as the risks to include infection, either immediate or long-term, uterine perforation or migration requiring surgery to remove, other complications such as pain, hormonal side effects, infertility and possibility of failure with subsequent pregnancy.   Exam with Kim assistant Pelvic: External BUS vagina normal. Cervix normal. Uterus retroverted normal size shape contour midline mobile nontender. Adnexa without masses or tenderness.  Procedure: The cervix was visualized with speculum and the IUD string was grasped with a [place] forcep and the old Mirena IUD was removed without difficulty, shown to the patient and discarded. The cervix was then cleansed with Betadine, anterior lip grasped with a single-tooth tenaculum, the uterus was sounded and a Mirena IUD was placed according to manufacturer's recommendations without difficulty. The strings were trimmed. The patient tolerated well and will follow up in one month for a postinsertional check.  Lot number:  [id number]    Dara LordsFONTAINE,Sequoyah Ramone P MD, 8:25 AM 04/11/2015

## 2015-05-13 ENCOUNTER — Other Ambulatory Visit (HOSPITAL_COMMUNITY)
Admission: RE | Admit: 2015-05-13 | Discharge: 2015-05-13 | Disposition: A | Discharge status: Home / Self Care | Payer: 59 | Source: Ambulatory Visit | Attending: Women's Health | Admitting: Women's Health

## 2015-05-13 ENCOUNTER — Ambulatory Visit (INDEPENDENT_AMBULATORY_CARE_PROVIDER_SITE_OTHER): Payer: 59 | Admitting: Women's Health

## 2015-05-13 ENCOUNTER — Encounter: Payer: Self-pay | Admitting: Women's Health

## 2015-05-13 VITALS — BP 128/80 | Ht 65.0 in | Wt 222.0 lb

## 2015-05-13 DIAGNOSIS — Z23 Encounter for immunization: Secondary | ICD-10-CM | POA: Diagnosis not present

## 2015-05-13 DIAGNOSIS — Z1151 Encounter for screening for human papillomavirus (HPV): Secondary | ICD-10-CM | POA: Insufficient documentation

## 2015-05-13 DIAGNOSIS — B009 Herpesviral infection, unspecified: Secondary | ICD-10-CM | POA: Diagnosis not present

## 2015-05-13 DIAGNOSIS — Z01419 Encounter for gynecological examination (general) (routine) without abnormal findings: Secondary | ICD-10-CM | POA: Diagnosis not present

## 2015-05-13 DIAGNOSIS — Z975 Presence of (intrauterine) contraceptive device: Secondary | ICD-10-CM

## 2015-05-13 MED ORDER — VALACYCLOVIR HCL 500 MG PO TABS: 500.0000 mg | ORAL_TABLET | Freq: Two times a day (BID) | ORAL | Status: AC

## 2015-05-13 NOTE — Progress Notes (Signed)
Darreld Mcleanemica Lavonna RuaM Detjen 10/01/1975 [id number]    History:    Presents for annual exam.  03/2015 Mirena IUD removed and replaced. Rare bleeding. Normal Pap history. HSV-1 rare outbreaks.  Past medical history, past surgical history, family history and social history were all reviewed and documented in the EPIC chart. 3 sons ages 305, 5912 and 3317 oldest son has physical/ mental delays. Works part time and attending [employer]. Finished pharmacy tech program.  ROS:  A ROS was performed and pertinent positives and negatives are included.  Exam:  Filed Vitals:   05/13/15 0905  BP: 128/80    General appearance:  Normal Thyroid:  Symmetrical, normal in size, without palpable masses or nodularity. Respiratory  Auscultation:  Clear without wheezing or rhonchi Cardiovascular  Auscultation:  Regular rate, without rubs, murmurs or gallops  Edema/varicosities:  Not grossly evident Abdominal  Soft,nontender, without masses, guarding or rebound.  Liver/spleen:  No organomegaly noted  Hernia:  None appreciated  Skin  Inspection:  Grossly normal   Breasts: Examined lying and sitting.     Right: Without masses, retractions, discharge or axillary adenopathy.     Left: Without masses, retractions, discharge or axillary adenopathy. Gentitourinary   Inguinal/mons:  Normal without inguinal adenopathy  External genitalia:  Normal  BUS/Urethra/Skene's glands:  Normal  Vagina:  Normal  Cervix:  Normal IUD strings visible  Uterus:   normal in size, shape and contour.  Midline and mobile  Adnexa/parametria:     Rt: Without masses or tenderness.   Lt: Without masses or tenderness.  Anus and perineum: Normal  Digital rectal exam: Normal sphincter tone without palpated masses or tenderness  Assessment/Plan:  39 y.o.MBF G3P3  for annual exam with no complaints.  03/2015 Mirena IUD rare bleeding HSV-1 rare outbreaks Obesity Primary care manages labs and anxiety/Xanax  Plan: SBE's, exercise, calcium rich diet,  vitamin D 1000 daily encouraged. Reviewed importance of decreasing carbs and increasing exercise for weight loss. Valtrex 500 twice a day 3-5 days when necessary. UA, Pap with HR HPV typing, new screening guidelines reviewed.  Harrington ChallengerYOUNG,NANCY J [facility]WHNP, 10:21 AM 05/13/2015

## 2015-05-13 NOTE — Patient Instructions (Signed)
Health Maintenance, Female Adopting a healthy lifestyle and getting preventive care can go a long way to promote health and wellness. Talk with your health care provider about what schedule of regular examinations is right for you. This is a good chance for you to check in with your provider about disease prevention and staying healthy. In between checkups, there are plenty of things you can do on your own. Experts have done a lot of research about which lifestyle changes and preventive measures are most likely to keep you healthy. Ask your health care provider for more information. WEIGHT AND DIET  Eat a healthy diet  Be sure to include plenty of vegetables, fruits, low-fat dairy products, and lean protein.  Do not eat a lot of foods high in solid fats, added sugars, or salt.  Get regular exercise. This is one of the most important things you can do for your health.  Most adults should exercise for at least 150 minutes each week. The exercise should increase your heart rate and make you sweat (moderate-intensity exercise).  Most adults should also do strengthening exercises at least twice a week. This is in addition to the moderate-intensity exercise.  Maintain a healthy weight  Body mass index (BMI) is a measurement that can be used to identify possible weight problems. It estimates body fat based on height and weight. Your health care provider can help determine your BMI and help you achieve or maintain a healthy weight.  For females 20 years of age and older:   A BMI below 18.5 is considered underweight.  A BMI of 18.5 to 24.9 is normal.  A BMI of 25 to 29.9 is considered overweight.  A BMI of 30 and above is considered obese.  Watch levels of cholesterol and blood lipids  You should start having your blood tested for lipids and cholesterol at 37 years of age, then have this test every 5 years.  You may need to have your cholesterol levels checked more often if:  Your lipid  or cholesterol levels are high.  You are older than 37 years of age.  You are at high risk for heart disease.  CANCER SCREENING   Lung Cancer  Lung cancer screening is recommended for adults 55-80 years old who are at high risk for lung cancer because of a history of smoking.  A yearly low-dose CT scan of the lungs is recommended for people who:  Currently smoke.  Have quit within the past 15 years.  Have at least a 30-pack-year history of smoking. A pack year is smoking an average of one pack of cigarettes a day for 1 year.  Yearly screening should continue until it has been 15 years since you quit.  Yearly screening should stop if you develop a health problem that would prevent you from having lung cancer treatment.  Breast Cancer  Practice breast self-awareness. This means understanding how your breasts normally appear and feel.  It also means doing regular breast self-exams. Let your health care provider know about any changes, no matter how small.  If you are in your 20s or 30s, you should have a clinical breast exam (CBE) by a health care provider every 1-3 years as part of a regular health exam.  If you are 40 or older, have a CBE every year. Also consider having a breast X-ray (mammogram) every year.  If you have a family history of breast cancer, talk to your health care provider about genetic screening.  If you   are at high risk for breast cancer, talk to your health care provider about having an MRI and a mammogram every year.  Breast cancer gene (BRCA) assessment is recommended for women who have family members with BRCA-related cancers. BRCA-related cancers include:  Breast.  Ovarian.  Tubal.  Peritoneal cancers.  Results of the assessment will determine the need for genetic counseling and BRCA1 and BRCA2 testing. Cervical Cancer Your health care provider may recommend that you be screened regularly for cancer of the pelvic organs (ovaries, uterus, and  vagina). This screening involves a pelvic examination, including checking for microscopic changes to the surface of your cervix (Pap test). You may be encouraged to have this screening done every 3 years, beginning at age 21.  For women ages 30-65, health care providers may recommend pelvic exams and Pap testing every 3 years, or they may recommend the Pap and pelvic exam, combined with testing for human papilloma virus (HPV), every 5 years. Some types of HPV increase your risk of cervical cancer. Testing for HPV may also be done on women of any age with unclear Pap test results.  Other health care providers may not recommend any screening for nonpregnant women who are considered low risk for pelvic cancer and who do not have symptoms. Ask your health care provider if a screening pelvic exam is right for you.  If you have had past treatment for cervical cancer or a condition that could lead to cancer, you need Pap tests and screening for cancer for at least 20 years after your treatment. If Pap tests have been discontinued, your risk factors (such as having a new sexual partner) need to be reassessed to determine if screening should resume. Some women have medical problems that increase the chance of getting cervical cancer. In these cases, your health care provider may recommend more frequent screening and Pap tests. Colorectal Cancer  This type of cancer can be detected and often prevented.  Routine colorectal cancer screening usually begins at 37 years of age and continues through 37 years of age.  Your health care provider may recommend screening at an earlier age if you have risk factors for colon cancer.  Your health care provider may also recommend using home test kits to check for hidden blood in the stool.  A small camera at the end of a tube can be used to examine your colon directly (sigmoidoscopy or colonoscopy). This is done to check for the earliest forms of colorectal  cancer.  Routine screening usually begins at age 50.  Direct examination of the colon should be repeated every 5-10 years through 37 years of age. However, you may need to be screened more often if early forms of precancerous polyps or small growths are found. Skin Cancer  Check your skin from head to toe regularly.  Tell your health care provider about any new moles or changes in moles, especially if there is a change in a mole's shape or color.  Also tell your health care provider if you have a mole that is larger than the size of a pencil eraser.  Always use sunscreen. Apply sunscreen liberally and repeatedly throughout the day.  Protect yourself by wearing long sleeves, pants, a wide-brimmed hat, and sunglasses whenever you are outside. HEART DISEASE, DIABETES, AND HIGH BLOOD PRESSURE   High blood pressure causes heart disease and increases the risk of stroke. High blood pressure is more likely to develop in:  People who have blood pressure in the high end   of the normal range (130-139/85-89 mm Hg).  People who are overweight or obese.  People who are African American.  If you are 38-23 years of age, have your blood pressure checked every 3-5 years. If you are 61 years of age or older, have your blood pressure checked every year. You should have your blood pressure measured twice--once when you are at a hospital or clinic, and once when you are not at a hospital or clinic. Record the average of the two measurements. To check your blood pressure when you are not at a hospital or clinic, you can use:  An automated blood pressure machine at a pharmacy.  A home blood pressure monitor.  If you are between 45 years and 39 years old, ask your health care provider if you should take aspirin to prevent strokes.  Have regular diabetes screenings. This involves taking a blood sample to check your fasting blood sugar level.  If you are at a normal weight and have a low risk for diabetes,  have this test once every three years after 37 years of age.  If you are overweight and have a high risk for diabetes, consider being tested at a younger age or more often. PREVENTING INFECTION  Hepatitis B  If you have a higher risk for hepatitis B, you should be screened for this virus. You are considered at high risk for hepatitis B if:  You were born in a country where hepatitis B is common. Ask your health care provider which countries are considered high risk.  Your parents were born in a high-risk country, and you have not been immunized against hepatitis B (hepatitis B vaccine).  You have HIV or AIDS.  You use needles to inject street drugs.  You live with someone who has hepatitis B.  You have had sex with someone who has hepatitis B.  You get hemodialysis treatment.  You take certain medicines for conditions, including cancer, organ transplantation, and autoimmune conditions. Hepatitis C  Blood testing is recommended for:  Everyone born from [birth year] through 1963.  Anyone with known risk factors for hepatitis C. Sexually transmitted infections (STIs)  You should be screened for sexually transmitted infections (STIs) including gonorrhea and chlamydia if:  You are sexually active and are younger than 37 years of age.  You are older than 37 years of age and your health care provider tells you that you are at risk for this type of infection.  Your sexual activity has changed since you were last screened and you are at an increased risk for chlamydia or gonorrhea. Ask your health care provider if you are at risk.  If you do not have HIV, but are at risk, it may be recommended that you take a prescription medicine daily to prevent HIV infection. This is called pre-exposure prophylaxis (PrEP). You are considered at risk if:  You are sexually active and do not regularly use condoms or know the HIV status of your partner(s).  You take drugs by injection.  You are sexually  active with a partner who has HIV. Talk with your health care provider about whether you are at high risk of being infected with HIV. If you choose to begin PrEP, you should first be tested for HIV. You should then be tested every 3 months for as long as you are taking PrEP.  PREGNANCY   If you are premenopausal and you may become pregnant, ask your health care provider about preconception counseling.  If you may  become pregnant, take 400 to 800 micrograms (mcg) of folic acid every day.  If you want to prevent pregnancy, talk to your health care provider about birth control (contraception). OSTEOPOROSIS AND MENOPAUSE   Osteoporosis is a disease in which the bones lose minerals and strength with aging. This can result in serious bone fractures. Your risk for osteoporosis can be identified using a bone density scan.  If you are 61 years of age or older, or if you are at risk for osteoporosis and fractures, ask your health care provider if you should be screened.  Ask your health care provider whether you should take a calcium or vitamin D supplement to lower your risk for osteoporosis.  Menopause may have certain physical symptoms and risks.  Hormone replacement therapy may reduce some of these symptoms and risks. Talk to your health care provider about whether hormone replacement therapy is right for you.  HOME CARE INSTRUCTIONS   Schedule regular health, dental, and eye exams.  Stay current with your immunizations.   Do not use any tobacco products including cigarettes, chewing tobacco, or electronic cigarettes.  If you are pregnant, do not drink alcohol.  If you are breastfeeding, limit how much and how often you drink alcohol.  Limit alcohol intake to no more than 1 drink per day for nonpregnant women. One drink equals 12 ounces of beer, 5 ounces of wine, or 1 ounces of hard liquor.  Do not use street drugs.  Do not share needles.  Ask your health care provider for help if  you need support or information about quitting drugs.  Tell your health care provider if you often feel depressed.  Tell your health care provider if you have ever been abused or do not feel safe at home.   This information is not intended to replace advice given to you by your health care provider. Make sure you discuss any questions you have with your health care provider.   Document Released: 12/15/2010 Document Revised: 06/22/2014 Document Reviewed: 05/03/2013 Elsevier Interactive Patient Education [employer].

## 2015-05-14 LAB — CYTOLOGY - PAP

## 2017-01-07 IMAGING — CT CT ABD-PELV W/ CM
2 of 4 series · 9 of 36 positions shown, 15 images · IV contrast (READICAT/WATER & [ID] ISOVUE 300)
Comparison: None.

CLINICAL DATA: Palpable right upper quadrant soft tissue mass.

EXAM:
CT ABDOMEN AND PELVIS WITH CONTRAST
TECHNIQUE: Multidetector CT imaging of the abdomen and pelvis was performed
using the standard protocol following bolus administration of
intravenous contrast.
CONTRAST:  125 cc [id number]

[Series 3: abd/pelvis with · axial · 0.76mm/px · z∈[-331,+14]mm · 8 of 82 slices shown, 13 images]
[im 10/82  soft-tissue]
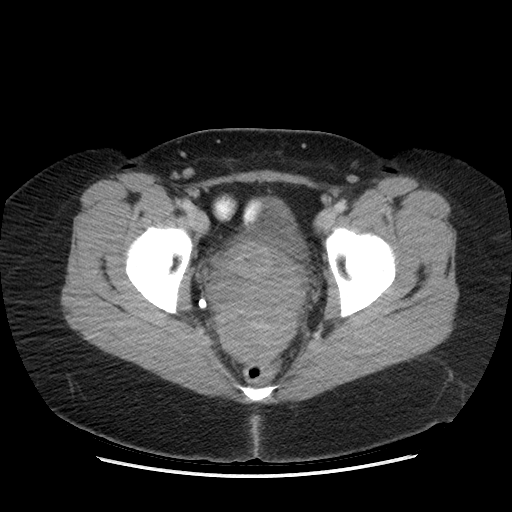
[im 10/82  bone]
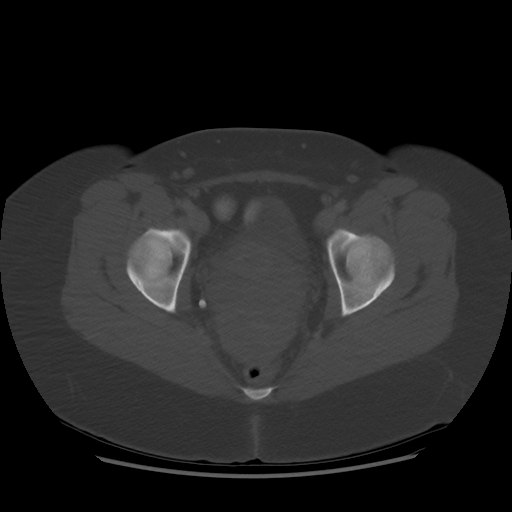
[im 19/82  soft-tissue]
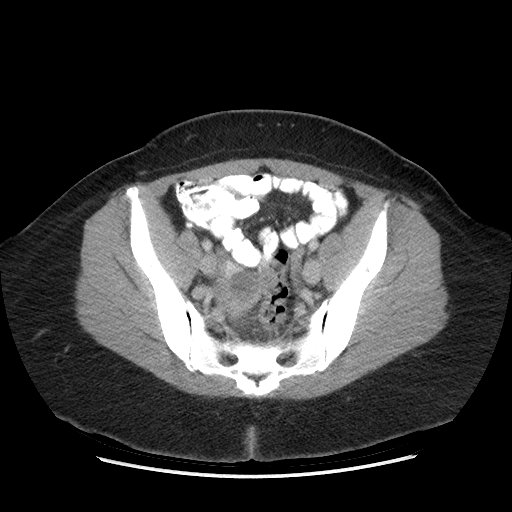
[im 28/82  soft-tissue]
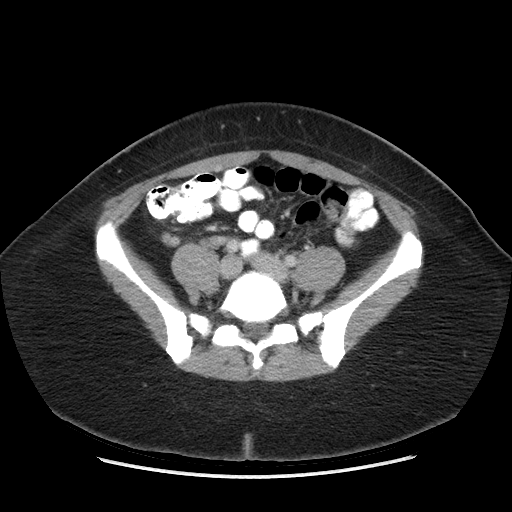
[im 37/82  soft-tissue]
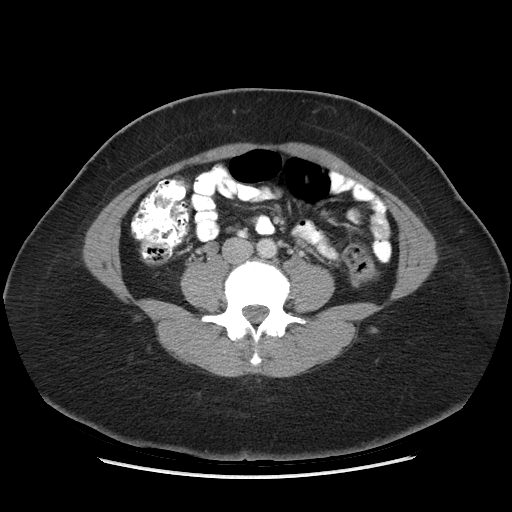
[im 46/82  soft-tissue]
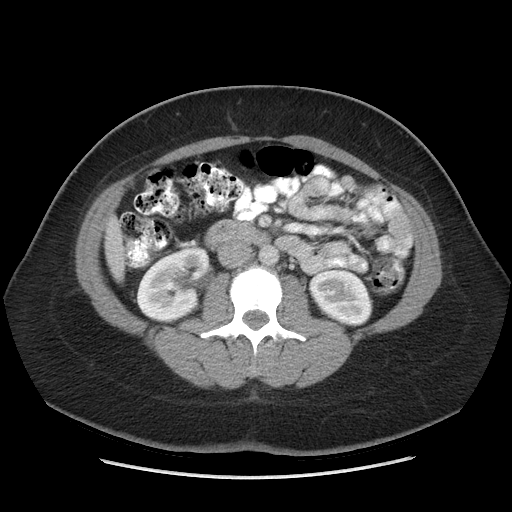
[im 46/82  lung]
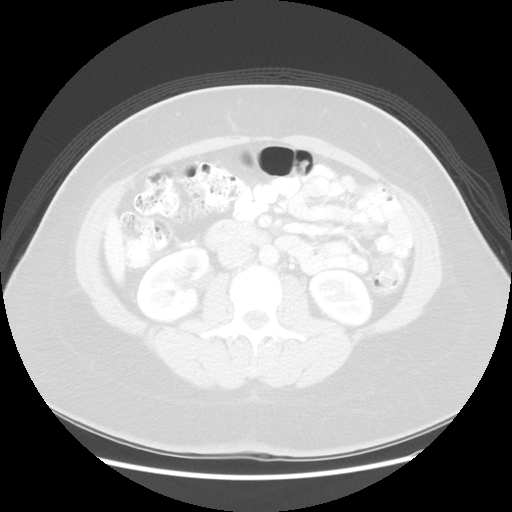
[im 55/82  soft-tissue]
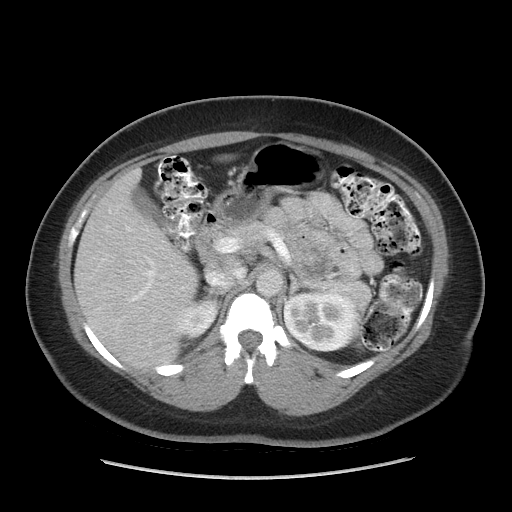
[im 55/82  lung]
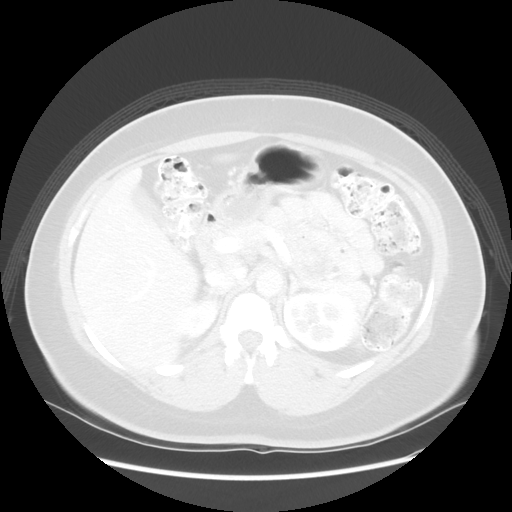
[im 64/82  soft-tissue]
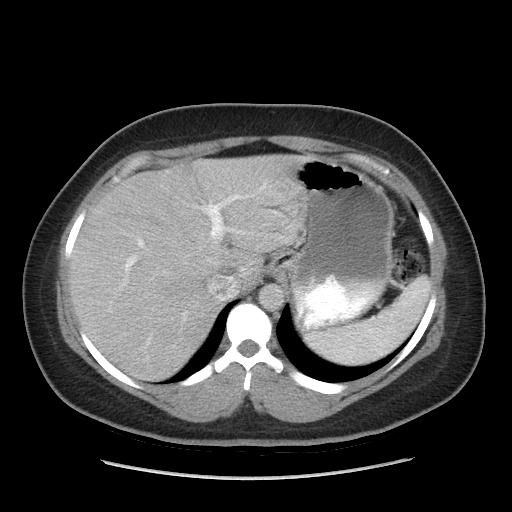
[im 64/82  lung]
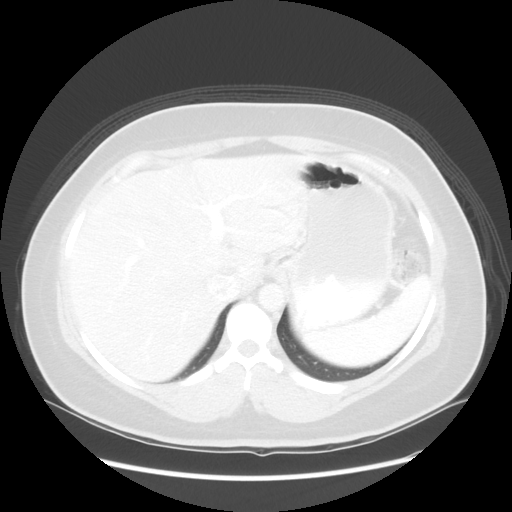
[im 73/82  soft-tissue]
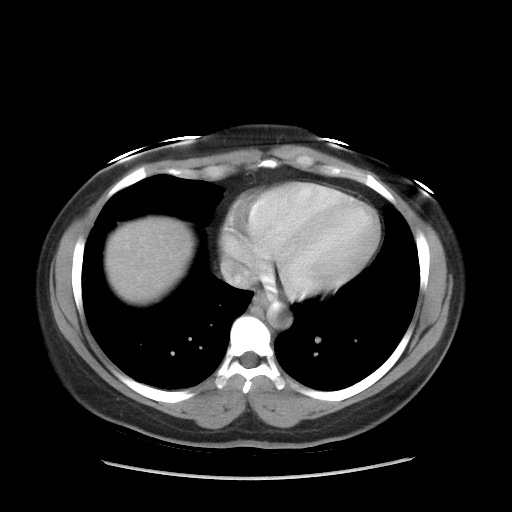
[im 73/82  lung]
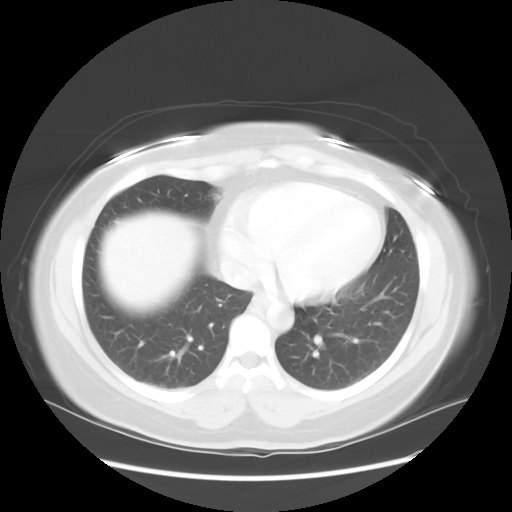

[Series 601: coronal body · coronal · 0.96mm/px · 1 of 123 slices shown, 2 images]
[im 41/123  soft-tissue]
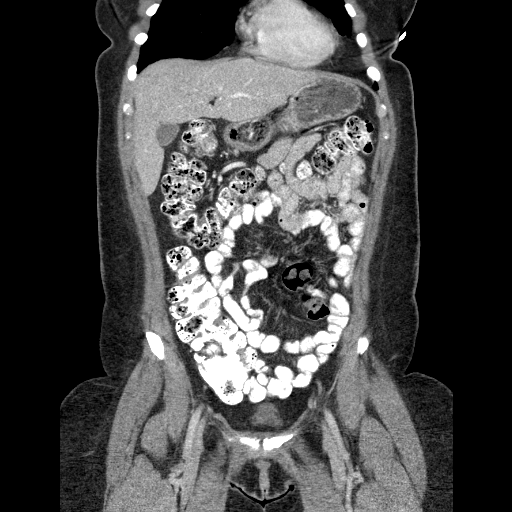
[im 41/123  bone]
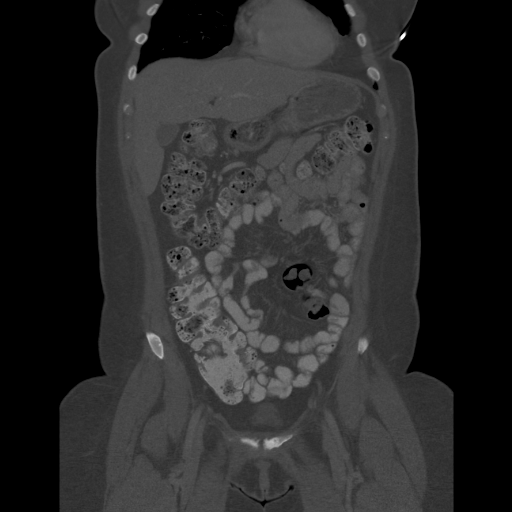

[9 of 36 positions shown; findings below may reference images not displayed]

FINDINGS: Lower chest: The lung bases are clear of acute process. No pleural
effusion or pulmonary lesions. The heart is normal in size. No
pericardial effusion. The distal esophagus and aorta are
unremarkable.

Hepatobiliary: No focal hepatic lesions or intrahepatic biliary
dilatation. The gallbladder is normal. No common bile duct
dilatation.

Pancreas: No inflammatory changes or mass lesions. The pancreatic
duct is normal.

Spleen: Normal size.  No focal lesions.

Adrenals/Urinary Tract: The adrenal glands and kidneys are normal.

Stomach/Bowel: The stomach, duodenum, small bowel and colon are
unremarkable. No inflammatory changes, mass lesions or obstructive
findings. The terminal ileum is normal. The appendix is normal.

Vascular/Lymphatic: No mesenteric or retroperitoneal mass or
adenopathy. The aorta is normal in caliber. The branch vessels are
normal. The major venous structures are patent.

Other: The uterus is retroverted and contains an IUD. Uterine
fibroids are noted. The ovaries are unremarkable. The bladder is
normal. No pelvic mass or adenopathy. No free pelvic fluid
collections. No inguinal mass or adenopathy.

I do not see any subcutaneous soft tissue masses or mass involving
the abdominal wall musculature. On the right side the right anterior
seventh rib has a slightly more vertical than curving horizontal
course (series 3 image 18) with slight anterior protruding costal
chondral cartilage. This could explain the patient's palpable
abnormality given its asymmetry with the left side.

Musculoskeletal: No significant bony findings.
IMPRESSION: 1. No acute abdominal/pelvic findings, mass lesions or adenopathy.
2. Retroverted uterus containing IUD with uterine fibroids.
3. The patient's palpable abnormality may be due to a slight anomaly
involving the seventh right anterior rib with bulging costochondral
cartilage. No subcutaneous lesions or muscular lesion.

## 2017-02-09 ENCOUNTER — Encounter: Payer: Self-pay | Admitting: Women's Health

## 2017-02-09 ENCOUNTER — Ambulatory Visit (INDEPENDENT_AMBULATORY_CARE_PROVIDER_SITE_OTHER): Payer: BLUE CROSS/BLUE SHIELD | Admitting: Women's Health

## 2017-02-09 VITALS — BP 130/82 | Ht 65.0 in | Wt 220.0 lb

## 2017-02-09 DIAGNOSIS — N898 Other specified noninflammatory disorders of vagina: Secondary | ICD-10-CM | POA: Diagnosis not present

## 2017-02-09 DIAGNOSIS — Z13220 Encounter for screening for lipoid disorders: Secondary | ICD-10-CM | POA: Diagnosis not present

## 2017-02-09 DIAGNOSIS — N760 Acute vaginitis: Secondary | ICD-10-CM | POA: Diagnosis not present

## 2017-02-09 DIAGNOSIS — B9689 Other specified bacterial agents as the cause of diseases classified elsewhere: Secondary | ICD-10-CM | POA: Diagnosis not present

## 2017-02-09 DIAGNOSIS — Z01419 Encounter for gynecological examination (general) (routine) without abnormal findings: Secondary | ICD-10-CM

## 2017-02-09 DIAGNOSIS — R350 Frequency of micturition: Secondary | ICD-10-CM

## 2017-02-09 LAB — WET PREP FOR TRICH, YEAST, CLUE
Clue Cells Wet Prep HPF POC: NONE SEEN
Trich, Wet Prep: NONE SEEN
Yeast Wet Prep HPF POC: NONE SEEN

## 2017-02-09 LAB — CBC WITH DIFFERENTIAL/PLATELET
BASOS PCT: 1 %
Basophils Absolute: 85 cells/uL (ref 0–200)
EOS ABS: 170 {cells}/uL (ref 15–500)
Eosinophils Relative: 2 %
HCT: 37.2 % (ref 35.0–45.0)
HEMOGLOBIN: 11.9 g/dL (ref 11.7–15.5)
LYMPHS PCT: 33 %
Lymphs Abs: 2805 cells/uL (ref 850–3900)
MCH: 29.3 pg (ref 27.0–33.0)
MCHC: 32 g/dL (ref 32.0–36.0)
MCV: 91.6 fL (ref 80.0–100.0)
MPV: 10 fL (ref 7.5–12.5)
Monocytes Absolute: 595 cells/uL (ref 200–950)
Monocytes Relative: 7 %
NEUTROS ABS: 4845 {cells}/uL (ref 1500–7800)
NEUTROS PCT: 57 %
Platelets: 353 10*3/uL (ref 140–400)
RBC: 4.06 MIL/uL (ref 3.80–5.10)
RDW: 13.1 % (ref 11.0–15.0)
WBC: 8.5 10*3/uL (ref 3.8–10.8)

## 2017-02-09 LAB — URINALYSIS W MICROSCOPIC + REFLEX CULTURE
Bilirubin Urine: NEGATIVE
Casts: NONE SEEN [LPF]
Crystals: NONE SEEN [HPF]
GLUCOSE, UA: NEGATIVE
Ketones, ur: NEGATIVE
NITRITE: NEGATIVE
PH: 6 (ref 5.0–8.0)
SPECIFIC GRAVITY, URINE: 1.025 (ref 1.001–1.035)
Yeast: NONE SEEN [HPF]

## 2017-02-09 MED ORDER — METRONIDAZOLE 500 MG PO TABS: 500.0000 mg | ORAL_TABLET | Freq: Two times a day (BID) | ORAL | 0 refills | Status: DC

## 2017-02-09 NOTE — Patient Instructions (Signed)
Carbohydrate Counting for Diabetes Mellitus, Adult Carbohydrate counting is a method for keeping track of how many carbohydrates you eat. Eating carbohydrates naturally increases the amount of sugar (glucose) in the blood. Counting how many carbohydrates you eat helps keep your blood glucose within normal limits, which helps you manage your diabetes (diabetes mellitus). It is important to know how many carbohydrates you can safely have in each meal. This is different for every person. A diet and nutrition specialist (registered dietitian) can help you make a meal plan and calculate how many carbohydrates you should have at each meal and snack. Carbohydrates are found in the following foods:  Grains, such as breads and cereals.  Dried beans and soy products.  Starchy vegetables, such as potatoes, peas, and corn.  Fruit and fruit juices.  Milk and yogurt.  Sweets and snack foods, such as cake, cookies, candy, chips, and soft drinks.  How do I count carbohydrates? There are two ways to count carbohydrates in food. You can use either of the methods or a combination of both. Reading "Nutrition Facts" on packaged food The "Nutrition Facts" list is included on the labels of almost all packaged foods and beverages in the [place] It includes:  The serving size.  Information about nutrients in each serving, including the grams (g) of carbohydrate per serving.  To use the "Nutrition Facts":  Decide how many servings you will have.  Multiply the number of servings by the number of carbohydrates per serving.  The resulting number is the total amount of carbohydrates that you will be having.  Learning standard serving sizes of other foods When you eat foods containing carbohydrates that are not packaged or do not include "Nutrition Facts" on the label, you need to measure the servings in order to count the amount of carbohydrates:  Measure the foods that you will eat with a food scale or  measuring cup, if needed.  Decide how many standard-size servings you will eat.  Multiply the number of servings by 15. Most carbohydrate-rich foods have about 15 g of carbohydrates per serving. ? For example, if you eat 8 oz (170 g) of strawberries, you will have eaten 2 servings and 30 g of carbohydrates (2 servings x 15 g = 30 g).  For foods that have more than one food mixed, such as soups and casseroles, you must count the carbohydrates in each food that is included.  The following list contains standard serving sizes of common carbohydrate-rich foods. Each of these servings has about 15 g of carbohydrates:   hamburger bun or  English muffin.   oz (15 mL) syrup.   oz (14 g) jelly.  1 slice of bread.  1 six-inch tortilla.  3 oz (85 g) cooked rice or pasta.  4 oz (113 g) cooked dried beans.  4 oz (113 g) starchy vegetable, such as peas, corn, or potatoes.  4 oz (113 g) hot cereal.  4 oz (113 g) mashed potatoes or  of a large baked potato.  4 oz (113 g) canned or frozen fruit.  4 oz (120 mL) fruit juice.  4-6 crackers.  6 chicken nuggets.  6 oz (170 g) unsweetened dry cereal.  6 oz (170 g) plain fat-free yogurt or yogurt sweetened with artificial sweeteners.  8 oz (240 mL) milk.  8 oz (170 g) fresh fruit or one small piece of fruit.  24 oz (680 g) popped popcorn.  Example of carbohydrate counting Sample meal  3 oz (85 g) chicken breast.    6 oz (170 g) brown rice.  4 oz (113 g) corn.  8 oz (240 mL) milk.  8 oz (170 g) strawberries with sugar-free whipped topping. Carbohydrate calculation 1. Identify the foods that contain carbohydrates: ? Rice. ? Corn. ? Milk. ? Strawberries. 2. Calculate how many servings you have of each food: ? 2 servings rice. ? 1 serving corn. ? 1 serving milk. ? 1 serving strawberries. 3. Multiply each number of servings by 15 g: ? 2 servings rice x 15 g = 30 g. ? 1 serving corn x 15 g = 15 g. ? 1 serving milk x 15  g = 15 g. ? 1 serving strawberries x 15 g = 15 g. 4. Add together all of the amounts to find the total grams of carbohydrates eaten: ? 30 g + 15 g + 15 g + 15 g = 75 g of carbohydrates total. This information is not intended to replace advice given to you by your health care provider. Make sure you discuss any questions you have with your health care provider. Document Released: 06/01/2005 Document Revised: 12/20/2015 Document Reviewed: 11/13/2015 Elsevier Interactive Patient Education  2018 [place] Maintenance, Female Adopting a healthy lifestyle and getting preventive care can go a long way to promote health and wellness. Talk with your health care provider about what schedule of regular examinations is right for you. This is a good chance for you to check in with your provider about disease prevention and staying healthy. In between checkups, there are plenty of things you can do on your own. Experts have done a lot of research about which lifestyle changes and preventive measures are most likely to keep you healthy. Ask your health care provider for more information. Weight and diet Eat a healthy diet  Be sure to include plenty of vegetables, fruits, low-fat dairy products, and lean protein.  Do not eat a lot of foods high in solid fats, added sugars, or salt.  Get regular exercise. This is one of the most important things you can do for your health. ? Most adults should exercise for at least 150 minutes each week. The exercise should increase your heart rate and make you sweat (moderate-intensity exercise). ? Most adults should also do strengthening exercises at least twice a week. This is in addition to the moderate-intensity exercise.  Maintain a healthy weight  Body mass index (BMI) is a measurement that can be used to identify possible weight problems. It estimates body fat based on height and weight. Your health care provider can help determine your BMI and help you  achieve or maintain a healthy weight.  For females 76 years of age and older: ? A BMI below 18.5 is considered underweight. ? A BMI of 18.5 to 24.9 is normal. ? A BMI of 25 to 29.9 is considered overweight. ? A BMI of 30 and above is considered obese.  Watch levels of cholesterol and blood lipids  You should start having your blood tested for lipids and cholesterol at 39 years of age, then have this test every 5 years.  You may need to have your cholesterol levels checked more often if: ? Your lipid or cholesterol levels are high. ? You are older than 39 years of age. ? You are at high risk for heart disease.  Cancer screening Lung Cancer  Lung cancer screening is recommended for adults 52-25 years old who are at high risk for lung cancer because of a history of smoking.  A  yearly low-dose CT scan of the lungs is recommended for people who: ? Currently smoke. ? Have quit within the past 15 years. ? Have at least a 30-pack-year history of smoking. A pack year is smoking an average of one pack of cigarettes a day for 1 year.  Yearly screening should continue until it has been 15 years since you quit.  Yearly screening should stop if you develop a health problem that would prevent you from having lung cancer treatment.  Breast Cancer  Practice breast self-awareness. This means understanding how your breasts normally appear and feel.  It also means doing regular breast self-exams. Let your health care provider know about any changes, no matter how small.  If you are in your 20s or 30s, you should have a clinical breast exam (CBE) by a health care provider every 1-3 years as part of a regular health exam.  If you are 4 or older, have a CBE every year. Also consider having a breast X-ray (mammogram) every year.  If you have a family history of breast cancer, talk to your health care provider about genetic screening.  If you are at high risk for breast cancer, talk to your health  care provider about having an MRI and a mammogram every year.  Breast cancer gene (BRCA) assessment is recommended for women who have family members with BRCA-related cancers. BRCA-related cancers include: ? Breast. ? Ovarian. ? Tubal. ? Peritoneal cancers.  Results of the assessment will determine the need for genetic counseling and BRCA1 and BRCA2 testing.  Cervical Cancer Your health care provider may recommend that you be screened regularly for cancer of the pelvic organs (ovaries, uterus, and vagina). This screening involves a pelvic examination, including checking for microscopic changes to the surface of your cervix (Pap test). You may be encouraged to have this screening done every 3 years, beginning at age 12.  For women ages 13-65, health care providers may recommend pelvic exams and Pap testing every 3 years, or they may recommend the Pap and pelvic exam, combined with testing for human papilloma virus (HPV), every 5 years. Some types of HPV increase your risk of cervical cancer. Testing for HPV may also be done on women of any age with unclear Pap test results.  Other health care providers may not recommend any screening for nonpregnant women who are considered low risk for pelvic cancer and who do not have symptoms. Ask your health care provider if a screening pelvic exam is right for you.  If you have had past treatment for cervical cancer or a condition that could lead to cancer, you need Pap tests and screening for cancer for at least 20 years after your treatment. If Pap tests have been discontinued, your risk factors (such as having a new sexual partner) need to be reassessed to determine if screening should resume. Some women have medical problems that increase the chance of getting cervical cancer. In these cases, your health care provider may recommend more frequent screening and Pap tests.  Colorectal Cancer  This type of cancer can be detected and often  prevented.  Routine colorectal cancer screening usually begins at 39 years of age and continues through 39 years of age.  Your health care provider may recommend screening at an earlier age if you have risk factors for colon cancer.  Your health care provider may also recommend using home test kits to check for hidden blood in the stool.  A small camera at the end of  a tube can be used to examine your colon directly (sigmoidoscopy or colonoscopy). This is done to check for the earliest forms of colorectal cancer.  Routine screening usually begins at age 45.  Direct examination of the colon should be repeated every 5-10 years through 39 years of age. However, you may need to be screened more often if early forms of precancerous polyps or small growths are found.  Skin Cancer  Check your skin from head to toe regularly.  Tell your health care provider about any new moles or changes in moles, especially if there is a change in a mole's shape or color.  Also tell your health care provider if you have a mole that is larger than the size of a pencil eraser.  Always use sunscreen. Apply sunscreen liberally and repeatedly throughout the day.  Protect yourself by wearing long sleeves, pants, a wide-brimmed hat, and sunglasses whenever you are outside.  Heart disease, diabetes, and high blood pressure  High blood pressure causes heart disease and increases the risk of stroke. High blood pressure is more likely to develop in: ? People who have blood pressure in the high end of the normal range (130-139/85-89 mm Hg). ? People who are overweight or obese. ? People who are African American.  If you are 65-44 years of age, have your blood pressure checked every 3-5 years. If you are 35 years of age or older, have your blood pressure checked every year. You should have your blood pressure measured twice-once when you are at a hospital or clinic, and once when you are not at a hospital or clinic.  Record the average of the two measurements. To check your blood pressure when you are not at a hospital or clinic, you can use: ? An automated blood pressure machine at a pharmacy. ? A home blood pressure monitor.  If you are between 55 years and 4 years old, ask your health care provider if you should take aspirin to prevent strokes.  Have regular diabetes screenings. This involves taking a blood sample to check your fasting blood sugar level. ? If you are at a normal weight and have a low risk for diabetes, have this test once every three years after 39 years of age. ? If you are overweight and have a high risk for diabetes, consider being tested at a younger age or more often. Preventing infection Hepatitis B  If you have a higher risk for hepatitis B, you should be screened for this virus. You are considered at high risk for hepatitis B if: ? You were born in a country where hepatitis B is common. Ask your health care provider which countries are considered high risk. ? Your parents were born in a high-risk country, and you have not been immunized against hepatitis B (hepatitis B vaccine). ? You have HIV or AIDS. ? You use needles to inject street drugs. ? You live with someone who has hepatitis B. ? You have had sex with someone who has hepatitis B. ? You get hemodialysis treatment. ? You take certain medicines for conditions, including cancer, organ transplantation, and autoimmune conditions.  Hepatitis C  Blood testing is recommended for: ? Everyone born from [birth year] through 1963. ? Anyone with known risk factors for hepatitis C.  Sexually transmitted infections (STIs)  You should be screened for sexually transmitted infections (STIs) including gonorrhea and chlamydia if: ? You are sexually active and are younger than 39 years of age. ? You are older than  39 years of age and your health care provider tells you that you are at risk for this type of infection. ? Your sexual  activity has changed since you were last screened and you are at an increased risk for chlamydia or gonorrhea. Ask your health care provider if you are at risk.  If you do not have HIV, but are at risk, it may be recommended that you take a prescription medicine daily to prevent HIV infection. This is called pre-exposure prophylaxis (PrEP). You are considered at risk if: ? You are sexually active and do not regularly use condoms or know the HIV status of your partner(s). ? You take drugs by injection. ? You are sexually active with a partner who has HIV.  Talk with your health care provider about whether you are at high risk of being infected with HIV. If you choose to begin PrEP, you should first be tested for HIV. You should then be tested every 3 months for as long as you are taking PrEP. Pregnancy  If you are premenopausal and you may become pregnant, ask your health care provider about preconception counseling.  If you may become pregnant, take 400 to 800 micrograms (mcg) of folic acid every day.  If you want to prevent pregnancy, talk to your health care provider about birth control (contraception). Osteoporosis and menopause  Osteoporosis is a disease in which the bones lose minerals and strength with aging. This can result in serious bone fractures. Your risk for osteoporosis can be identified using a bone density scan.  If you are 29 years of age or older, or if you are at risk for osteoporosis and fractures, ask your health care provider if you should be screened.  Ask your health care provider whether you should take a calcium or vitamin D supplement to lower your risk for osteoporosis.  Menopause may have certain physical symptoms and risks.  Hormone replacement therapy may reduce some of these symptoms and risks. Talk to your health care provider about whether hormone replacement therapy is right for you. Follow these instructions at home:  Schedule regular health, dental,  and eye exams.  Stay current with your immunizations.  Do not use any tobacco products including cigarettes, chewing tobacco, or electronic cigarettes.  If you are pregnant, do not drink alcohol.  If you are breastfeeding, limit how much and how often you drink alcohol.  Limit alcohol intake to no more than 1 drink per day for nonpregnant women. One drink equals 12 ounces of beer, 5 ounces of wine, or 1 ounces of hard liquor.  Do not use street drugs.  Do not share needles.  Ask your health care provider for help if you need support or information about quitting drugs.  Tell your health care provider if you often feel depressed.  Tell your health care provider if you have ever been abused or do not feel safe at home. This information is not intended to replace advice given to you by your health care provider. Make sure you discuss any questions you have with your health care provider. Document Released: 12/15/2010 Document Revised: 11/07/2015 Document Reviewed: 03/05/2015 Elsevier Interactive Patient Education  [employer].

## 2017-02-09 NOTE — Progress Notes (Signed)
[name] Sep 25, 1975 [id number]    History:    Presents for annual exam.  03/2015 Mirena IUD rare bleeding. Normal Pap history. HSV 1 rare outbreaks.  Past medical history, past surgical history, family history and social history were all reviewed and documented in the EPIC chart. Has 3 sons 7, 56, 59, oldest one with autism. Works in a [employer].  ROS:  A ROS was performed and pertinent positives and negatives are included.  Exam:  Vitals:   02/09/17 1044  BP: 130/82  Weight: 220 lb (99.8 kg)  Height: 5\' 5"  (1.651 m)   Body mass index is 36.61 kg/m.   General appearance:  Normal Thyroid:  Symmetrical, normal in size, without palpable masses or nodularity. Respiratory  Auscultation:  Clear without wheezing or rhonchi Cardiovascular  Auscultation:  Regular rate, without rubs, murmurs or gallops  Edema/varicosities:  Not grossly evident Abdominal  Soft,nontender, without masses, guarding or rebound.  Liver/spleen:  No organomegaly noted  Hernia:  None appreciated  Skin  Inspection:  Grossly normal   Breasts: Examined lying and sitting.     Right: Without masses, retractions, discharge or axillary adenopathy.     Left: Without masses, retractions, discharge or axillary adenopathy. Gentitourinary   Inguinal/mons:  Normal without inguinal adenopathy  External genitalia:  Normal  BUS/Urethra/Skene's glands:  Normal  Vagina:  Moderate brown discharge with odor noted, wet prep negative for clues, moderate bacteria  Cervix:  IUD strings in os  Uterus:   normal in size, shape and contour.  Midline and mobile  Adnexa/parametria:     Rt: Without masses or tenderness.   Lt: Without masses or tenderness.  Anus and perineum: Normal  Digital rectal exam: Normal sphincter tone without palpated masses or tenderness  Assessment/Plan:  41 y.o. SBF G5 P3 for annual exam with complaint of vaginal odor for about 2 weeks off and on.  03/2015 Mirena IUD rare spotting HSV 1  rare outbreaks Clinical BP Obesity  Plan: Options reviewed, Flagyl 500 twice daily for 7 days, prescription, proper use given and reviewed alcohol precautions. Instructed to call if continued problems. SBE's, increase exercise and decrease calories and carbs for weight loss encouraged. Denies need for refill on Valtrex. CBC, CMP, lipid panel, Pap normal with negative HR HPV 2016, new screening guidelines reviewed.  Harrington Challenger [facility], 11:09 AM 02/09/2017

## 2017-02-10 ENCOUNTER — Encounter: Payer: Self-pay | Admitting: Women's Health

## 2017-02-10 LAB — LIPID PANEL
CHOL/HDL RATIO: 3.6 ratio (ref <5.0)
Cholesterol: 150 mg/dL (ref <200)
HDL: 42 mg/dL — AB (ref >50)
LDL Cholesterol: 94 mg/dL (ref <100)
TRIGLYCERIDES: 69 mg/dL (ref <150)
VLDL: 14 mg/dL (ref <30)

## 2017-02-10 LAB — COMPREHENSIVE METABOLIC PANEL
ALBUMIN: 4 g/dL (ref 3.6–5.1)
ALK PHOS: 52 U/L (ref 33–115)
ALT: 11 U/L (ref 6–29)
AST: 12 U/L (ref 10–30)
BILIRUBIN TOTAL: 0.4 mg/dL (ref 0.2–1.2)
BUN: 9 mg/dL (ref 7–25)
CO2: 20 mmol/L (ref 20–32)
Calcium: 8.8 mg/dL (ref 8.6–10.2)
Chloride: 105 mmol/L (ref 98–110)
Creat: 0.98 mg/dL (ref 0.50–1.10)
GLUCOSE: 83 mg/dL (ref 65–99)
POTASSIUM: 4 mmol/L (ref 3.5–5.3)
Sodium: 138 mmol/L (ref 135–146)
Total Protein: 6.9 g/dL (ref 6.1–8.1)

## 2017-02-10 LAB — URINE CULTURE

## 2018-07-12 ENCOUNTER — Encounter: Payer: BLUE CROSS/BLUE SHIELD | Admitting: Women's Health

## 2019-03-07 ENCOUNTER — Encounter: Payer: Self-pay | Admitting: Gynecology

## 2019-11-08 ENCOUNTER — Other Ambulatory Visit: Payer: Self-pay

## 2019-11-09 ENCOUNTER — Encounter: Payer: Self-pay | Admitting: Nurse Practitioner

## 2019-11-09 ENCOUNTER — Ambulatory Visit: Payer: 59 | Admitting: Nurse Practitioner

## 2019-11-09 VITALS — BP 148/84 | Ht 65.0 in | Wt 253.0 lb

## 2019-11-09 DIAGNOSIS — Z01419 Encounter for gynecological examination (general) (routine) without abnormal findings: Secondary | ICD-10-CM

## 2019-11-09 DIAGNOSIS — B009 Herpesviral infection, unspecified: Secondary | ICD-10-CM | POA: Diagnosis not present

## 2019-11-09 DIAGNOSIS — Z975 Presence of (intrauterine) contraceptive device: Secondary | ICD-10-CM | POA: Diagnosis not present

## 2019-11-09 NOTE — Patient Instructions (Signed)
Health Maintenance, Female Adopting a healthy lifestyle and getting preventive care are important in promoting health and wellness. Ask your health care provider about:  The right schedule for you to have regular tests and exams.  Things you can do on your own to prevent diseases and keep yourself healthy. What should I know about diet, weight, and exercise? Eat a healthy diet   Eat a diet that includes plenty of vegetables, fruits, low-fat dairy products, and lean protein.  Do not eat a lot of foods that are high in solid fats, added sugars, or sodium. Maintain a healthy weight Body mass index (BMI) is used to identify weight problems. It estimates body fat based on height and weight. Your health care provider can help determine your BMI and help you achieve or maintain a healthy weight. Get regular exercise Get regular exercise. This is one of the most important things you can do for your health. Most adults should:  Exercise for at least 150 minutes each week. The exercise should increase your heart rate and make you sweat (moderate-intensity exercise).  Do strengthening exercises at least twice a week. This is in addition to the moderate-intensity exercise.  Spend less time sitting. Even light physical activity can be beneficial. Watch cholesterol and blood lipids Have your blood tested for lipids and cholesterol at 42 years of age, then have this test every 5 years. Have your cholesterol levels checked more often if:  Your lipid or cholesterol levels are high.  You are older than 42 years of age.  You are at high risk for heart disease. What should I know about cancer screening? Depending on your health history and family history, you may need to have cancer screening at various ages. This may include screening for:  Breast cancer.  Cervical cancer.  Colorectal cancer.  Skin cancer.  Lung cancer. What should I know about heart disease, diabetes, and high blood  pressure? Blood pressure and heart disease  High blood pressure causes heart disease and increases the risk of stroke. This is more likely to develop in people who have high blood pressure readings, are of African descent, or are overweight.  Have your blood pressure checked: ? Every 3-5 years if you are 18-39 years of age. ? Every year if you are 40 years old or older. Diabetes Have regular diabetes screenings. This checks your fasting blood sugar level. Have the screening done:  Once every three years after age 40 if you are at a normal weight and have a low risk for diabetes.  More often and at a younger age if you are overweight or have a high risk for diabetes. What should I know about preventing infection? Hepatitis B If you have a higher risk for hepatitis B, you should be screened for this virus. Talk with your health care provider to find out if you are at risk for hepatitis B infection. Hepatitis C Testing is recommended for:  Everyone born from 1945 through 1965.  Anyone with known risk factors for hepatitis C. Sexually transmitted infections (STIs)  Get screened for STIs, including gonorrhea and chlamydia, if: ? You are sexually active and are younger than 42 years of age. ? You are older than 42 years of age and your health care provider tells you that you are at risk for this type of infection. ? Your sexual activity has changed since you were last screened, and you are at increased risk for chlamydia or gonorrhea. Ask your health care provider if   you are at risk.  Ask your health care provider about whether you are at high risk for HIV. Your health care provider may recommend a prescription medicine to help prevent HIV infection. If you choose to take medicine to prevent HIV, you should first get tested for HIV. You should then be tested every 3 months for as long as you are taking the medicine. Pregnancy  If you are about to stop having your period (premenopausal) and  you may become pregnant, seek counseling before you get pregnant.  Take 400 to 800 micrograms (mcg) of folic acid every day if you become pregnant.  Ask for birth control (contraception) if you want to prevent pregnancy. Osteoporosis and menopause Osteoporosis is a disease in which the bones lose minerals and strength with aging. This can result in bone fractures. If you are 65 years old or older, or if you are at risk for osteoporosis and fractures, ask your health care provider if you should:  Be screened for bone loss.  Take a calcium or vitamin D supplement to lower your risk of fractures.  Be given hormone replacement therapy (HRT) to treat symptoms of menopause. Follow these instructions at home: Lifestyle  Do not use any products that contain nicotine or tobacco, such as cigarettes, e-cigarettes, and chewing tobacco. If you need help quitting, ask your health care provider.  Do not use street drugs.  Do not share needles.  Ask your health care provider for help if you need support or information about quitting drugs. Alcohol use  Do not drink alcohol if: ? Your health care provider tells you not to drink. ? You are pregnant, may be pregnant, or are planning to become pregnant.  If you drink alcohol: ? Limit how much you use to 0-1 drink a day. ? Limit intake if you are breastfeeding.  Be aware of how much alcohol is in your drink. In the [place] one drink equals one 12 oz bottle of beer (355 mL), one 5 oz glass of wine (148 mL), or one 1 oz glass of hard liquor (44 mL). General instructions  Schedule regular health, dental, and eye exams.  Stay current with your vaccines.  Tell your health care provider if: ? You often feel depressed. ? You have ever been abused or do not feel safe at home. Summary  Adopting a healthy lifestyle and getting preventive care are important in promoting health and wellness.  Follow your health care provider's instructions about healthy  diet, exercising, and getting tested or screened for diseases.  Follow your health care provider's instructions on monitoring your cholesterol and blood pressure. This information is not intended to replace advice given to you by your health care provider. Make sure you discuss any questions you have with your health care provider. Document Revised: 05/25/2018 Document Reviewed: 05/25/2018 Elsevier Patient Education  2020 Elsevier Inc.  

## 2019-11-09 NOTE — Progress Notes (Signed)
   [name] [birth year] [id number]   History:  44 y.o. G5 P3  presents for annual exam without GYN complaints. Amenorrheic with Mirena IUD. This her second one. Not currently sexually active.  Maternal aunt and cousin with history of breast cancer.   Gynecologic History No LMP recorded (lmp unknown). (Menstrual status: IUD).   Contraception: IUD Mirena placed 03/2015 Last Pap: 08/03/2017. Results were: normal Last mammogram: 11/21/2018. Results were: normal  Past medical history, past surgical history, family history and social history were all reviewed and documented in the EPIC chart. Works for [employer]. 3 sons ages 42,42, and 42.   ROS:  A ROS was performed and pertinent positives and negatives are included.  Exam:  Vitals:   11/09/19 1433  BP: (!) 148/84  Weight: 253 lb (114.8 kg)  Height: 5\' 5"  (1.651 m)   Body mass index is 42.1 kg/m.  General appearance:  Normal Thyroid:  Symmetrical, normal in size, without palpable masses or nodularity. Respiratory  Auscultation:  Clear without wheezing or rhonchi Cardiovascular  Auscultation:  Regular rate, without rubs, murmurs or gallops  Edema/varicosities:  Not grossly evident Abdominal  Soft,nontender, without masses, guarding or rebound.  Liver/spleen:  No organomegaly noted  Hernia:  None appreciated  Skin  Inspection:  Grossly normal   Breasts: Examined lying and sitting.   Right: Without masses, retractions, discharge or axillary adenopathy.   Left: Without masses, retractions, discharge or axillary adenopathy. Gentitourinary   Inguinal/mons:  Normal without inguinal adenopathy  External genitalia:  Normal  BUS/Urethra/Skene's glands:  Normal  Vagina:  Normal  Cervix:  Normal, string visible in os  Uterus:  Normal in size, shape and contour.  Midline and mobile  Adnexa/parametria:     Rt: Without masses or tenderness.   Lt: Without masses or tenderness.  Anus and perineum: Normal  Digital rectal  exam: Normal sphincter tone without palpated masses or tenderness  Assessment/Plan:  44 y.o.  for annual exam.     Well female exam with routine gynecological exam - Education provided on SBEs, importance of preventative screenings, current guidelines, high calcium diet, regular exercise, and multivitamin daily.   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 Will schedule IUD removal and reinsertion for October.   Follow up in 1 year for annual    [facility], 2:41 PM 11/09/2019

## 2020-03-19 ENCOUNTER — Ambulatory Visit: Payer: Self-pay | Admitting: Obstetrics and Gynecology

## 2020-03-25 ENCOUNTER — Other Ambulatory Visit: Payer: Self-pay

## 2020-03-25 ENCOUNTER — Encounter (HOSPITAL_COMMUNITY): Payer: Self-pay | Admitting: Emergency Medicine

## 2020-03-25 ENCOUNTER — Ambulatory Visit (HOSPITAL_COMMUNITY)
Admission: EM | Admit: 2020-03-25 | Discharge: 2020-03-25 | Disposition: A | Discharge status: Home / Self Care | Payer: 59 | Attending: Family Medicine | Admitting: Family Medicine

## 2020-03-25 DIAGNOSIS — M62838 Other muscle spasm: Secondary | ICD-10-CM

## 2020-03-25 DIAGNOSIS — S161XXA Strain of muscle, fascia and tendon at neck level, initial encounter: Secondary | ICD-10-CM

## 2020-03-25 DIAGNOSIS — R030 Elevated blood-pressure reading, without diagnosis of hypertension: Secondary | ICD-10-CM

## 2020-03-25 MED ORDER — DICLOFENAC SODIUM 75 MG PO TBEC: 75.0000 mg | DELAYED_RELEASE_TABLET | Freq: Two times a day (BID) | ORAL | 0 refills | Status: AC

## 2020-03-25 MED ORDER — CYCLOBENZAPRINE HCL 10 MG PO TABS: ORAL_TABLET | ORAL | 0 refills | Status: AC
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
## 2020-03-25 NOTE — Discharge Instructions (Addendum)
Your blood pressure was noted to be elevated during your visit today. If you are currently taking medication for high blood pressure, please ensure you are taking this as directed. If you do not have a history of high blood pressure and your blood pressure remains persistently elevated, you may need to begin taking a medication at some point. You may return here within the next few days to recheck if unable to see your primary care provider or if do not have a one.  BP (!) 176/98 (BP Location: Right Arm)   Pulse 72   Temp 98.8 F (37.1 C) (Oral)   Resp 17   SpO2 100%
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
## 2020-04-10 ENCOUNTER — Telehealth: Payer: Self-pay | Admitting: *Deleted

## 2020-04-10 NOTE — Telephone Encounter (Signed)
Per Alyssa Beady NP ok to have [facility]

## 2020-04-10 NOTE — Telephone Encounter (Signed)
Patient called saying her insurance does not cover the  Mirena IUD. Has appt tomorrow for IUD removal/insert. Can she switch to [facility]

## 2020-04-11 ENCOUNTER — Other Ambulatory Visit: Payer: Self-pay

## 2020-04-11 ENCOUNTER — Ambulatory Visit (INDEPENDENT_AMBULATORY_CARE_PROVIDER_SITE_OTHER): Payer: 59 | Admitting: Obstetrics and Gynecology

## 2020-04-11 ENCOUNTER — Encounter: Payer: Self-pay | Admitting: Obstetrics and Gynecology

## 2020-04-11 VITALS — BP 144/80

## 2020-04-11 DIAGNOSIS — Z30430 Encounter for insertion of intrauterine contraceptive device: Secondary | ICD-10-CM

## 2020-04-11 DIAGNOSIS — Z30433 Encounter for removal and reinsertion of intrauterine contraceptive device: Secondary | ICD-10-CM

## 2020-04-11 DIAGNOSIS — Z30432 Encounter for removal of intrauterine contraceptive device: Secondary | ICD-10-CM

## 2020-04-11 NOTE — Progress Notes (Signed)
   [name]  August 02, 1975 [id number]  HPI The patient is a 44 y.o. [id number] who presents today for Mirena IUD insertion.  She had discussed this with Marny Lowenstein, NP.  She has done well with the Mirena IUD and has been amenorrheic with it in place.  I counseled her on the Mirena IUD including in the initial period of time after insertion to expect cramping and/or abnormal bleeding, especially in the first 3-6 months use.  Approved for 5 years of contraception.  After this time period the period can get very light or some women become amenorrheic.  Potential hormonal side effects discussed.  Insertion risks include infection, uterine perforation and device migration into the abdomen which would require surgical removal, or the device can become dislodged causing pain or fall out of which would preclude effective contraception.  Monthly self string checks (or more frequent) are recommended as IUD expulsion does occur.  She wanted to proceed with the Mirena IUD insertion and provided written consent.   Physical Exam  BP (!) 144/80 (BP Location: Right Arm, Patient Position: Sitting, Cuff Size: Large)   General: Pleasant female, no acute distress, alert and oriented PELVIC EXAM: VULVA: normal appearing vulva with no masses, tenderness or lesions, VAGINA: normal appearing vagina with normal color and discharge, no lesions, CERVIX: normal appearing patulous cervix without discharge or lesions, UTERUS: Retroverted uterus is normal size, shape, consistency and nontender, ADNEXA: normal adnexa in size, nontender and no masses  Procedure note Mirena IUD removal and insertion A Graves speculum was used to view the cervix.  A Cytobrush was used to gently probe the distal endocervical canal to reveal the IUD strings which were then grasped with a [place] forcep and gentle traction was applied to the strings resulting in easy removal of the old Mirena IUD without difficulty.  The IUD was removed in its  entirety as demonstrated to myself and staff pregnancy patient declined to see it). The cervix was cleansed with a Betadine swab.  The anterior lip of the cervix was grasped with an Allis clamp.  The cervix was gently dilated with the cervical os finder.  Endometrial pipelle was used to obtain a sound length of 7 cm.  Maintaining sterility, the flange on the IUD insertion tube was set to the appropriate length and the insertion tube was inserted into the cervix and endometrial cavity without difficulty until gentle fundal resistance was met.  The arms of the IUD were deployed and the Mirena was inserted without difficulty. The strings were trimmed to a length of 3 cm.  The patient tolerated the procedure well without any complication.  Wandra Scot Bonham present for exam and procedure  I recommended that the patient return in 1 month for an IUD string check.   Joseph Pierini MD 04/11/20

## 2020-04-17 ENCOUNTER — Encounter: Payer: Self-pay | Admitting: Gynecology

## 2020-05-06 ENCOUNTER — Ambulatory Visit: Payer: 59 | Admitting: Obstetrics and Gynecology

## 2020-05-14 ENCOUNTER — Encounter: Payer: Self-pay | Admitting: Obstetrics and Gynecology

## 2020-05-14 ENCOUNTER — Ambulatory Visit: Payer: 59 | Admitting: Obstetrics and Gynecology

## 2020-05-14 ENCOUNTER — Other Ambulatory Visit: Payer: Self-pay

## 2020-05-14 VITALS — BP 140/80

## 2020-05-14 DIAGNOSIS — Z30431 Encounter for routine checking of intrauterine contraceptive device: Secondary | ICD-10-CM | POA: Diagnosis not present

## 2020-05-14 DIAGNOSIS — R030 Elevated blood-pressure reading, without diagnosis of hypertension: Secondary | ICD-10-CM

## 2020-05-14 NOTE — Progress Notes (Signed)
   [name] 1975-09-02 [id number]  SUBJECTIVE:  44 y.o. [id number] female presents for an IUD string check.  She had a new Mirena IUD inserted 04/11/2020.  No problems so far with vaginal bleeding irregularity or pelvic discomfort.  Current Outpatient Medications  Medication Sig Dispense Refill  . ALPRAZolam (XANAX) 0.5 MG tablet Take 0.5 mg by mouth daily as needed for anxiety.     . cyclobenzaprine (FLEXERIL) 10 MG tablet Take 1 tablet by mouth 3 times daily as needed for muscle spasm. Warning: May cause drowsiness. 21 tablet 0  . diclofenac (VOLTAREN) 75 MG EC tablet Take 1 tablet (75 mg total) by mouth 2 (two) times daily. 14 tablet 0  . levonorgestrel (MIRENA) 20 MCG/24HR IUD 1 each by Intrauterine route once.    . valACYclovir (VALTREX) 500 MG tablet Take 1 tablet (500 mg total) by mouth 2 (two) times daily. For 3-5 days prn 30 tablet 12   No current facility-administered medications for this visit.   Allergies: Patient has no known allergies.  No LMP recorded. (Menstrual status: IUD).  Past medical history,surgical history, problem list, medications, allergies, family history and social history were all reviewed and documented as reviewed in the EPIC chart.   OBJECTIVE:  BP 140/80 (BP Location: Right Arm, Patient Position: Sitting, Cuff Size: Large)   140s/80s x 2 The patient appears well, alert, oriented, in no distress. PELVIC EXAM: VULVA: normal appearing vulva with no masses, tenderness or lesions, VAGINA: normal appearing vagina with normal color and discharge, no lesions, CERVIX: normal large patulous appearing cervix without discharge or lesions, IUD strings protrude 1 cm  Chaperone: Bari Mantis Bonham present during the examination  ASSESSMENT:  44 y.o. [id number] here for IUD string check  PLAN:  Patient is reassured IUD appears to be in appropriate position with visualization of the strings today. I notified her that her blood pressure was mildly elevated again today  as it has been on her last few visits to this office, which she says she is aware of as it typically runs high.  We discussed that elevated blood pressure can cause bad effects for cardiovascular system long-term so I would recommend that she make an appointment with her primary doctor, Dr. Wynelle Link for further evaluation and management of hypertension.   Theresia Majors MD 05/14/20

## 2021-03-25 ENCOUNTER — Other Ambulatory Visit: Payer: Self-pay

## 2021-03-25 ENCOUNTER — Ambulatory Visit (INDEPENDENT_AMBULATORY_CARE_PROVIDER_SITE_OTHER): Payer: 59 | Admitting: Otolaryngology

## 2021-03-25 DIAGNOSIS — H93A2 Pulsatile tinnitus, left ear: Secondary | ICD-10-CM
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
